# Patient Record
Sex: Female | Born: 1975 | Race: Black or African American | Hispanic: No | Marital: Single | Smoking: Never smoker
Health system: Southern US, Community
[De-identification: ages and names within clinical notes are randomized; demographics above are authoritative.]

## PROBLEM LIST (undated history)

## (undated) ENCOUNTER — Inpatient Hospital Stay (HOSPITAL_COMMUNITY): Payer: Self-pay

## (undated) DIAGNOSIS — J302 Other seasonal allergic rhinitis: Secondary | ICD-10-CM

## (undated) DIAGNOSIS — K219 Gastro-esophageal reflux disease without esophagitis: Secondary | ICD-10-CM

## (undated) DIAGNOSIS — D649 Anemia, unspecified: Secondary | ICD-10-CM

## (undated) HISTORY — PX: ABDOMINAL HYSTERECTOMY: SHX81

---

## 2001-08-05 ENCOUNTER — Emergency Department (HOSPITAL_COMMUNITY): Admission: EM | Admit: 2001-08-05 | Discharge: 2001-08-05 | Payer: Self-pay | Admitting: Emergency Medicine

## 2003-06-23 ENCOUNTER — Emergency Department (HOSPITAL_COMMUNITY): Admission: EM | Admit: 2003-06-23 | Discharge: 2003-06-23 | Payer: Self-pay | Admitting: Emergency Medicine

## 2004-08-31 ENCOUNTER — Other Ambulatory Visit: Admission: RE | Admit: 2004-08-31 | Discharge: 2004-08-31 | Payer: Self-pay | Admitting: Obstetrics & Gynecology

## 2004-12-31 ENCOUNTER — Inpatient Hospital Stay (HOSPITAL_COMMUNITY): Admission: AD | Admit: 2004-12-31 | Discharge: 2004-12-31 | Payer: Self-pay | Admitting: Obstetrics and Gynecology

## 2005-04-17 ENCOUNTER — Inpatient Hospital Stay (HOSPITAL_COMMUNITY): Admission: AD | Admit: 2005-04-17 | Discharge: 2005-04-17 | Payer: Self-pay | Admitting: Obstetrics and Gynecology

## 2005-04-18 ENCOUNTER — Inpatient Hospital Stay (HOSPITAL_COMMUNITY): Admission: RE | Admit: 2005-04-18 | Discharge: 2005-04-21 | Payer: Self-pay | Admitting: Obstetrics and Gynecology

## 2005-04-18 ENCOUNTER — Encounter (INDEPENDENT_AMBULATORY_CARE_PROVIDER_SITE_OTHER): Payer: Self-pay | Admitting: Specialist

## 2007-01-23 ENCOUNTER — Emergency Department (HOSPITAL_COMMUNITY): Admission: EM | Admit: 2007-01-23 | Discharge: 2007-01-23 | Payer: Self-pay | Admitting: Emergency Medicine

## 2007-01-30 ENCOUNTER — Emergency Department (HOSPITAL_COMMUNITY): Admission: EM | Admit: 2007-01-30 | Discharge: 2007-01-30 | Payer: Self-pay | Admitting: Emergency Medicine

## 2009-04-22 ENCOUNTER — Emergency Department (HOSPITAL_COMMUNITY): Admission: EM | Admit: 2009-04-22 | Discharge: 2009-04-22 | Payer: Self-pay | Admitting: Emergency Medicine

## 2009-08-12 ENCOUNTER — Inpatient Hospital Stay (HOSPITAL_COMMUNITY)
Admission: AD | Admit: 2009-08-12 | Discharge: 2009-08-12 | Payer: Self-pay | Source: Home / Self Care | Admitting: Obstetrics and Gynecology

## 2009-09-08 ENCOUNTER — Inpatient Hospital Stay (HOSPITAL_COMMUNITY): Admission: AD | Admit: 2009-09-08 | Discharge: 2009-09-09 | Payer: Self-pay | Admitting: Family Medicine

## 2009-09-08 ENCOUNTER — Ambulatory Visit: Payer: Self-pay | Admitting: Nurse Practitioner

## 2009-09-12 ENCOUNTER — Inpatient Hospital Stay (HOSPITAL_COMMUNITY): Admission: AD | Admit: 2009-09-12 | Discharge: 2009-09-12 | Payer: Self-pay | Admitting: Obstetrics & Gynecology

## 2009-09-12 ENCOUNTER — Ambulatory Visit: Payer: Self-pay | Admitting: Obstetrics & Gynecology

## 2009-09-18 ENCOUNTER — Ambulatory Visit: Payer: Self-pay | Admitting: Obstetrics & Gynecology

## 2010-01-04 ENCOUNTER — Emergency Department (HOSPITAL_COMMUNITY)
Admission: EM | Admit: 2010-01-04 | Discharge: 2010-01-04 | Payer: Self-pay | Source: Home / Self Care | Attending: Emergency Medicine | Admitting: Emergency Medicine

## 2010-02-16 ENCOUNTER — Other Ambulatory Visit: Payer: Self-pay | Admitting: Obstetrics and Gynecology

## 2010-03-11 ENCOUNTER — Other Ambulatory Visit (HOSPITAL_COMMUNITY): Payer: Self-pay | Admitting: Obstetrics and Gynecology

## 2010-03-11 DIAGNOSIS — Z0489 Encounter for examination and observation for other specified reasons: Secondary | ICD-10-CM

## 2010-03-11 DIAGNOSIS — Z3682 Encounter for antenatal screening for nuchal translucency: Secondary | ICD-10-CM

## 2010-03-30 ENCOUNTER — Ambulatory Visit (HOSPITAL_COMMUNITY)
Admission: RE | Admit: 2010-03-30 | Discharge: 2010-03-30 | Disposition: A | Discharge status: Home / Self Care | Payer: Medicaid Other | Source: Ambulatory Visit | Attending: Obstetrics and Gynecology | Admitting: Obstetrics and Gynecology

## 2010-03-30 ENCOUNTER — Other Ambulatory Visit (HOSPITAL_COMMUNITY): Payer: Self-pay | Admitting: Obstetrics and Gynecology

## 2010-03-30 ENCOUNTER — Ambulatory Visit (HOSPITAL_COMMUNITY): Admission: RE | Admit: 2010-03-30 | Payer: Medicaid Other | Source: Ambulatory Visit

## 2010-03-30 ENCOUNTER — Encounter (HOSPITAL_COMMUNITY): Payer: Self-pay

## 2010-03-30 DIAGNOSIS — E669 Obesity, unspecified: Secondary | ICD-10-CM | POA: Insufficient documentation

## 2010-03-30 DIAGNOSIS — O3510X0 Maternal care for (suspected) chromosomal abnormality in fetus, unspecified, not applicable or unspecified: Secondary | ICD-10-CM | POA: Insufficient documentation

## 2010-03-30 DIAGNOSIS — O351XX0 Maternal care for (suspected) chromosomal abnormality in fetus, not applicable or unspecified: Secondary | ICD-10-CM | POA: Insufficient documentation

## 2010-03-30 DIAGNOSIS — Z3689 Encounter for other specified antenatal screening: Secondary | ICD-10-CM | POA: Insufficient documentation

## 2010-03-30 DIAGNOSIS — O34219 Maternal care for unspecified type scar from previous cesarean delivery: Secondary | ICD-10-CM | POA: Insufficient documentation

## 2010-03-30 DIAGNOSIS — O99210 Obesity complicating pregnancy, unspecified trimester: Secondary | ICD-10-CM | POA: Insufficient documentation

## 2010-03-30 DIAGNOSIS — Z3682 Encounter for antenatal screening for nuchal translucency: Secondary | ICD-10-CM

## 2010-03-31 ENCOUNTER — Encounter (HOSPITAL_COMMUNITY)
Admission: RE | Admit: 2010-03-31 | Discharge: 2010-03-31 | Disposition: A | Discharge status: Home / Self Care | Payer: Medicaid Other | Source: Ambulatory Visit | Attending: Obstetrics and Gynecology | Admitting: Obstetrics and Gynecology

## 2010-03-31 DIAGNOSIS — Z01812 Encounter for preprocedural laboratory examination: Secondary | ICD-10-CM | POA: Insufficient documentation

## 2010-03-31 LAB — CBC
Hemoglobin: 10.9 g/dL — ABNORMAL LOW (ref 12.0–15.0)
MCHC: 32 g/dL (ref 30.0–36.0)
MCV: 81.4 fL (ref 78.0–100.0)
Platelets: 193 10*3/uL (ref 150–400)
RBC: 4.19 MIL/uL (ref 3.87–5.11)
RDW: 15.5 % (ref 11.5–15.5)

## 2010-04-01 ENCOUNTER — Ambulatory Visit (HOSPITAL_COMMUNITY)
Admission: RE | Admit: 2010-04-01 | Discharge: 2010-04-01 | Disposition: A | Discharge status: Home / Self Care | Payer: Medicaid Other | Source: Ambulatory Visit | Attending: Obstetrics and Gynecology | Admitting: Obstetrics and Gynecology

## 2010-04-01 ENCOUNTER — Other Ambulatory Visit: Payer: Self-pay | Admitting: Obstetrics and Gynecology

## 2010-04-01 DIAGNOSIS — O034 Incomplete spontaneous abortion without complication: Secondary | ICD-10-CM | POA: Insufficient documentation

## 2010-04-01 HISTORY — PX: DILATION AND CURETTAGE OF UTERUS: SHX78

## 2010-04-05 NOTE — Op Note (Signed)
  NAME:  Rachel Wu, JAHR NO.:  [id number]  MEDICAL RECORD NO.:  [id number]           PATIENT TYPE:  O  LOCATION:  [facility]                          [facility]  PHYSICIAN:  Malva Limes, M.D.    DATE OF BIRTH:  September 11, 1975  DATE OF PROCEDURE:  04/01/2010 DATE OF DISCHARGE:                              OPERATIVE REPORT   PREOPERATIVE DIAGNOSIS:  Spontaneous abortion.  POSTOPERATIVE DIAGNOSIS:  Spontaneous abortion.  PROCEDURE:  Dilation and evacuation.  SURGEON:  Malva Limes, MD  ANESTHESIA:  MAC and paracervical block.  DRAINS:  None.  ANTIBIOTICS:  Ancef 1 g.  ESTIMATED BLOOD LOSS:  30 mL.  SPECIMENS:  Products of conception sent to Pathology.  COMPLICATIONS:  None.  PROCEDURE:  The patient was taken to the operating room where she was placed in dorsal supine position.  MAC anesthesia was administered.  She was then placed in dorsal lithotomy position.  She was prepped with Betadine and draped in usual fashion for this procedure.  A sterile speculum was placed in the vagina.  20 mL of 1% lidocaine was used for paracervical block.  The cervix was serially dilated to a 29-French.  A 9-mm suction cannula was placed into the uterine cavity and products of conception withdrawn.  Sharp curettage was performed followed by repeat suction.  The patient tolerated the procedure well.  She was discharged to home.  She was sent home with Percocet to take p.r.n.  She was told to follow up in the office in 4 weeks.  The patient's blood type is A+ and therefore no RhoGAM is indicated.          ______________________________ Malva Limes, M.D.    MA/MEDQ  D:  04/01/2010  T:  04/02/2010  Job:  [id number]  Electronically Signed by Malva Limes M.D. on 04/05/2010 12:23:18 PM

## 2010-04-13 LAB — POCT URINALYSIS DIPSTICK
Bilirubin Urine: NEGATIVE
Glucose, UA: NEGATIVE mg/dL
Ketones, ur: NEGATIVE mg/dL
Nitrite: NEGATIVE
Protein, ur: NEGATIVE mg/dL
Specific Gravity, Urine: 1.015 (ref 1.005–1.030)
Urobilinogen, UA: 0.2 mg/dL (ref 0.0–1.0)
pH: 5.5 (ref 5.0–8.0)

## 2010-04-13 LAB — POCT PREGNANCY, URINE: Preg Test, Ur: NEGATIVE

## 2010-04-16 LAB — CBC
MCHC: 33 g/dL (ref 30.0–36.0)
MCV: 82 fL (ref 78.0–100.0)
Platelets: 195 10*3/uL (ref 150–400)
RBC: 3.98 MIL/uL (ref 3.87–5.11)
WBC: 6.3 10*3/uL (ref 4.0–10.5)

## 2010-04-16 LAB — WET PREP, GENITAL
Trich, Wet Prep: NONE SEEN
Yeast Wet Prep HPF POC: NONE SEEN

## 2010-04-16 LAB — GC/CHLAMYDIA PROBE AMP, GENITAL
Chlamydia, DNA Probe: NEGATIVE
GC Probe Amp, Genital: NEGATIVE

## 2010-04-18 LAB — GC/CHLAMYDIA PROBE AMP, GENITAL: Chlamydia, DNA Probe: NEGATIVE

## 2010-04-18 LAB — ABO/RH: ABO/RH(D): A POS

## 2010-04-18 LAB — CBC
HCT: 31.6 % — ABNORMAL LOW (ref 36.0–46.0)
Hemoglobin: 10.5 g/dL — ABNORMAL LOW (ref 12.0–15.0)
MCH: 26.7 pg (ref 26.0–34.0)
MCV: 80.6 fL (ref 78.0–100.0)
Platelets: 198 10*3/uL (ref 150–400)
RBC: 3.92 MIL/uL (ref 3.87–5.11)
WBC: 7.1 10*3/uL (ref 4.0–10.5)

## 2010-04-18 LAB — WET PREP, GENITAL: Yeast Wet Prep HPF POC: NONE SEEN

## 2010-04-18 LAB — HCG, QUANTITATIVE, PREGNANCY: hCG, Beta Chain, Quant, S: 15584 m[IU]/mL — ABNORMAL HIGH (ref <5)

## 2010-04-18 LAB — URINALYSIS, ROUTINE W REFLEX MICROSCOPIC
Bilirubin Urine: NEGATIVE
Specific Gravity, Urine: 1.01 (ref 1.005–1.030)

## 2010-04-18 LAB — POCT PREGNANCY, URINE: Preg Test, Ur: POSITIVE

## 2010-05-10 ENCOUNTER — Other Ambulatory Visit (HOSPITAL_COMMUNITY): Payer: Self-pay

## 2010-06-18 NOTE — Op Note (Signed)
NAME:  Rachel Wu, Rachel Wu NO.:  [id number]   MEDICAL RECORD NO.:  [id number]          PATIENT TYPE:  INP   LOCATION:  9102                          [facility]   PHYSICIAN:  Malva Limes, M.D.    DATE OF BIRTH:  08/14/1975   DATE OF PROCEDURE:  04/18/2005  DATE OF DISCHARGE:                                 OPERATIVE REPORT   PREOPERATIVE DIAGNOSES:  1.  Intrauterine pregnancy at term.  2.  History of prior cesarean section.  3.  Patient desires repeat cesarean section.   POSTOPERATIVE DIAGNOSES:  1.  Intrauterine pregnancy at term.  2.  History of prior cesarean section.  3.  Patient desires repeat cesarean section.  4.  Extensive adhesions.   OPERATION/PROCEDURE:  1.  Repeat low transverse cesarean section.  2.  Lysis of adhesions.   SURGEON:  Malva Limes, M.D.   ANESTHESIA:  Spinal.   ANTIBIOTICS:  Ancef 1 g.   DRAINS:  Foley to bedside drainage.   ESTIMATED BLOOD LOSS:  900 mL.   COMPLICATIONS:  None.   SPECIMENS:  Placenta sent to pathology.   FINDINGS:  The patient delivered one live viable black female, Apgars 8 at  one minute and 9 at five minutes.  Cord PH was 7.296. The weight was 7  pounds 7 ounces.   DESCRIPTION OF PROCEDURE:  The patient was taken to the operating room where  spinal anesthetic was administered without difficulty.  She was then placed  in the  dorsal supine position with a left lateral tilt.  The patient was  prepped with Betadine and a Foley was placed.  She was then draped in the  usual fashion for this procedure.   A Pfannenstiel incision was made through the previous scar.  This was  carried down to the fascia.  On entering the fascia, the Mayo scissors were  used to open up the fascia laterally.  The rectus muscles were then  dissected from the fascia with the Bovie.  Rectus muscles were divided in  the midline and taken superiorly and inferiorly.  Parietal peritoneum was  entered sharply.  On entering the  abdominal cavity, the patient was noted to  have dense omental adhesions involving the anterior uterus.  These were all  taken down with the Bovie.  Once the uterus was freed, the bladder flap was  taken down sharply.  A low transverse uterine incision was made in the  midline and extended laterally with blunt dissection.  On entering the  amniotic sac, moderate meconium was noted.  The infant was delivered in the  vertex presentation.  The nostrils and oropharynx DeLee and bulb-suctioned.  The remaining infant was then delivered.  The cord was doubly clamped, cut  and the baby handed to the waiting NICU team.  Cord gas and cord blood were  obtained.  The placenta was manually removed.  The uterine cavity was  cleaned with a wet lap.  The uterine incision was closed in a single layer  of 0 Monocryl in a running locking fashion.  Bladder flap was not closed.  The uterus  was placed back in the abdominal cavity.  Ovaries and fallopian  tubes appeared to be normal bilaterally.  At this point some omental  adhesions to the anterior abdominal wall were taken down slowly.  The  parietal peritoneum was then reapproximated in the midline using 2-0  Monocryl suture in a running fashion. The fascia was closed using 0 Monocryl  suture in a running fashion.  Subcuticular tissue was made hemostatic with  the Bovie.  Stainless steel clips were to close the skin. The patient  tolerated the procedure well.  She was taken to the recovery room in stable  condition.  Instrument and laps counts were correct x2.           ______________________________  Malva Limes, M.D.     MA/MEDQ  D:  04/18/2005  T:  04/19/2005  Job:  [id number]

## 2010-06-18 NOTE — Discharge Summary (Signed)
NAME:  Rachel Wu, Rachel Wu NO.:  [id number]   MEDICAL RECORD NO.:  [id number]          PATIENT TYPE:  INP   LOCATION:  9102                          [facility]   PHYSICIAN:  Malva Limes, M.D.    DATE OF BIRTH:  1976/01/26   DATE OF ADMISSION:  04/18/2005  DATE OF DISCHARGE:  04/21/2005                                 DISCHARGE SUMMARY   FINAL DIAGNOSIS:  1.  Intrauterine pregnancy at term.  2.  History of prior cesarean section, patient desires repeat cesarean      section.  3.  Extensive adhesions.  4.  Postoperative anemia.   PROCEDURE:  Repeat low segment transverse cesarean section and lysis of  adhesions.   SURGEON:  Dr. Malva Limes   COMPLICATIONS:  None.   This 35 year old G3, P1-0-1-1 presents at term for repeat cesarean section.  Patient had had a history of prior cesarean section with her past pregnancy,  desires a repeat with this pregnancy.  Her antepartum course up to this  point had been uncomplicated.  She did have a positive group B Strep  obtained in the office around 36 weeks.  She was taken to the operating room  on April 18, 2005 by Dr. Malva Limes where a repeat low transverse  cesarean section was performed with the delivery of a 7 pound 7 ounce female  infant with Apgars of 8 and 9.  Because there were a large amount of  adhesions these were lysed.  Patient's postoperative course was benign  without any significant fevers.  She did have some mild postoperative anemia  and was started on iron during her hospital stay.  She was ready for  discharge on postoperative day #3.  She was sent home on a regular diet,  told to decrease activity, told to continue her prenatal vitamins and an  iron supplement three times daily.  Was to take ibuprofen up to 600 mg every  six hours as needed for pain.  Was to use Percocet one to two every four  hours as needed for pain.  Was to follow up in the office on March 23 for  her staple removal.  Of  course to call with any increased bleeding, pain, or  problem.   LABORATORIES ON DISCHARGE:  Patient had a hemoglobin of 9.6, white blood  cell count of 8, platelets of 115,000.      Leilani Able, P.A.-C.    ______________________________  Malva Limes, M.D.    MB/MEDQ  D:  05/30/2005  T:  05/31/2005  Job:  [id number]

## 2010-08-10 ENCOUNTER — Inpatient Hospital Stay (INDEPENDENT_AMBULATORY_CARE_PROVIDER_SITE_OTHER)
Admission: RE | Admit: 2010-08-10 | Discharge: 2010-08-10 | Disposition: A | Discharge status: Home / Self Care | Payer: Self-pay | Source: Ambulatory Visit | Attending: Emergency Medicine | Admitting: Emergency Medicine

## 2010-08-10 DIAGNOSIS — Z331 Pregnant state, incidental: Secondary | ICD-10-CM

## 2010-08-10 DIAGNOSIS — S335XXA Sprain of ligaments of lumbar spine, initial encounter: Secondary | ICD-10-CM

## 2010-08-10 LAB — POCT URINALYSIS DIP (DEVICE)
Bilirubin Urine: NEGATIVE
Glucose, UA: NEGATIVE mg/dL
Leukocytes, UA: NEGATIVE
Nitrite: NEGATIVE
Protein, ur: NEGATIVE mg/dL
Urobilinogen, UA: 0.2 mg/dL (ref 0.0–1.0)
pH: 6.5 (ref 5.0–8.0)

## 2010-08-10 LAB — POCT PREGNANCY, URINE: Preg Test, Ur: POSITIVE

## 2010-08-26 LAB — ANTIBODY SCREEN: Antibody Screen: NEGATIVE

## 2010-08-26 LAB — HIV ANTIBODY (ROUTINE TESTING W REFLEX): HIV: NONREACTIVE

## 2010-08-26 LAB — HEPATITIS B SURFACE ANTIGEN: Hepatitis B Surface Ag: NEGATIVE

## 2010-08-26 LAB — ABO/RH: RH Type: POSITIVE

## 2010-08-26 LAB — RUBELLA ANTIBODY, IGM: Rubella: IMMUNE

## 2010-09-20 ENCOUNTER — Other Ambulatory Visit: Payer: Self-pay | Admitting: Obstetrics and Gynecology

## 2010-09-20 ENCOUNTER — Other Ambulatory Visit (HOSPITAL_COMMUNITY): Payer: Self-pay | Admitting: Obstetrics and Gynecology

## 2010-09-20 DIAGNOSIS — Z369 Encounter for antenatal screening, unspecified: Secondary | ICD-10-CM

## 2010-09-29 ENCOUNTER — Ambulatory Visit (HOSPITAL_COMMUNITY)
Admission: RE | Admit: 2010-09-29 | Discharge: 2010-09-29 | Disposition: A | Discharge status: Home / Self Care | Payer: Medicaid Other | Source: Ambulatory Visit | Attending: Obstetrics and Gynecology | Admitting: Obstetrics and Gynecology

## 2010-09-29 ENCOUNTER — Ambulatory Visit (HOSPITAL_COMMUNITY): Admission: RE | Admit: 2010-09-29 | Payer: Medicaid Other | Source: Ambulatory Visit

## 2010-09-29 ENCOUNTER — Encounter (HOSPITAL_COMMUNITY): Payer: Self-pay

## 2010-09-29 ENCOUNTER — Other Ambulatory Visit (HOSPITAL_COMMUNITY): Payer: Self-pay | Admitting: Obstetrics and Gynecology

## 2010-09-29 VITALS — BP 109/71 | HR 85 | Wt 268.0 lb

## 2010-09-29 DIAGNOSIS — O34219 Maternal care for unspecified type scar from previous cesarean delivery: Secondary | ICD-10-CM | POA: Insufficient documentation

## 2010-09-29 DIAGNOSIS — Z369 Encounter for antenatal screening, unspecified: Secondary | ICD-10-CM

## 2010-09-29 DIAGNOSIS — O99210 Obesity complicating pregnancy, unspecified trimester: Secondary | ICD-10-CM | POA: Insufficient documentation

## 2010-09-29 DIAGNOSIS — O351XX0 Maternal care for (suspected) chromosomal abnormality in fetus, not applicable or unspecified: Secondary | ICD-10-CM | POA: Insufficient documentation

## 2010-09-29 DIAGNOSIS — O3510X0 Maternal care for (suspected) chromosomal abnormality in fetus, unspecified, not applicable or unspecified: Secondary | ICD-10-CM | POA: Insufficient documentation

## 2010-09-29 DIAGNOSIS — E669 Obesity, unspecified: Secondary | ICD-10-CM | POA: Insufficient documentation

## 2010-09-29 NOTE — Progress Notes (Signed)
Ultrasound in AS/OBGYN/EPIC.  Follow up U/S scheduled; First trimester screen

## 2010-10-06 ENCOUNTER — Other Ambulatory Visit (HOSPITAL_COMMUNITY): Payer: Self-pay | Admitting: Obstetrics and Gynecology

## 2010-10-06 ENCOUNTER — Ambulatory Visit (HOSPITAL_COMMUNITY)
Admission: RE | Admit: 2010-10-06 | Discharge: 2010-10-06 | Disposition: A | Discharge status: Home / Self Care | Payer: Medicaid Other | Source: Ambulatory Visit | Attending: Obstetrics and Gynecology | Admitting: Obstetrics and Gynecology

## 2010-10-06 ENCOUNTER — Other Ambulatory Visit: Payer: Self-pay | Admitting: Maternal and Fetal Medicine

## 2010-10-06 DIAGNOSIS — Z369 Encounter for antenatal screening, unspecified: Secondary | ICD-10-CM

## 2010-10-06 DIAGNOSIS — O3510X0 Maternal care for (suspected) chromosomal abnormality in fetus, unspecified, not applicable or unspecified: Secondary | ICD-10-CM | POA: Insufficient documentation

## 2010-10-06 DIAGNOSIS — Z3689 Encounter for other specified antenatal screening: Secondary | ICD-10-CM | POA: Insufficient documentation

## 2010-10-06 DIAGNOSIS — O351XX0 Maternal care for (suspected) chromosomal abnormality in fetus, not applicable or unspecified: Secondary | ICD-10-CM | POA: Insufficient documentation

## 2010-11-11 ENCOUNTER — Ambulatory Visit (HOSPITAL_COMMUNITY)
Admission: RE | Admit: 2010-11-11 | Discharge: 2010-11-11 | Disposition: A | Discharge status: Home / Self Care | Payer: Medicaid Other | Source: Ambulatory Visit | Attending: Obstetrics and Gynecology | Admitting: Obstetrics and Gynecology

## 2010-11-11 DIAGNOSIS — O34219 Maternal care for unspecified type scar from previous cesarean delivery: Secondary | ICD-10-CM | POA: Insufficient documentation

## 2010-11-11 DIAGNOSIS — O99210 Obesity complicating pregnancy, unspecified trimester: Secondary | ICD-10-CM | POA: Insufficient documentation

## 2010-11-11 DIAGNOSIS — Z369 Encounter for antenatal screening, unspecified: Secondary | ICD-10-CM

## 2010-11-11 DIAGNOSIS — Z1389 Encounter for screening for other disorder: Secondary | ICD-10-CM | POA: Insufficient documentation

## 2010-11-11 DIAGNOSIS — E669 Obesity, unspecified: Secondary | ICD-10-CM | POA: Insufficient documentation

## 2010-11-11 DIAGNOSIS — Z363 Encounter for antenatal screening for malformations: Secondary | ICD-10-CM | POA: Insufficient documentation

## 2010-11-11 DIAGNOSIS — O358XX0 Maternal care for other (suspected) fetal abnormality and damage, not applicable or unspecified: Secondary | ICD-10-CM | POA: Insufficient documentation

## 2010-12-01 IMAGING — US US OB TRANSVAGINAL MODIFY
1 series · 13 of 28 positions shown · non-contrast
Comparison: Prior ultrasound of pregnancy performed 08/12/2009

CLINICAL DATA: Vaginal bleeding.

OBSTETRIC <14 WK US AND TRANSVAGINAL OB US
TECHNIQUE: Both transabdominal and transvaginal ultrasound
examinations were performed for complete evaluation of the
gestation as well as the maternal uterus, adnexal regions, and
pelvic cul-de-sac.  Transvaginal technique was performed to assess
early pregnancy.

[Series 1: us ob comp less 14 wks · 0.21mm/px · 13 of 31 slices shown]
[im 2/31]
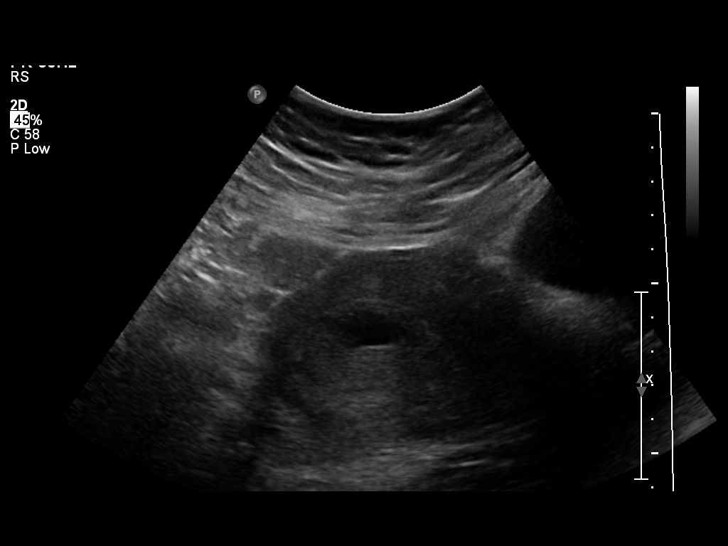
[im 4/31]
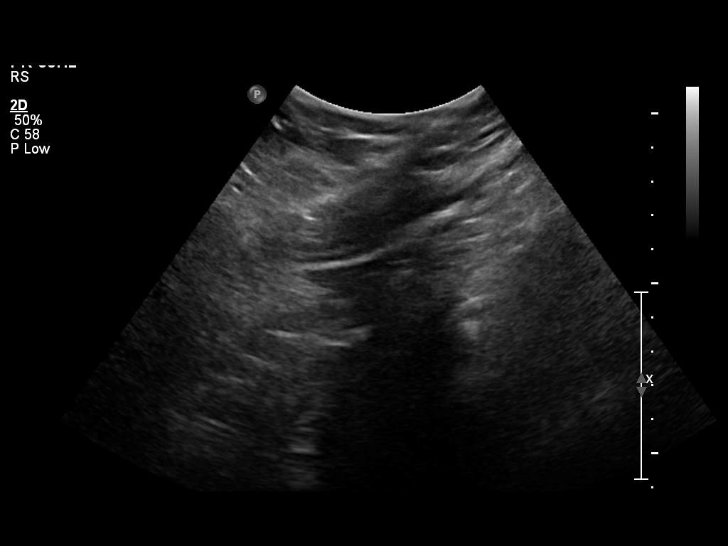
[im 6/31]
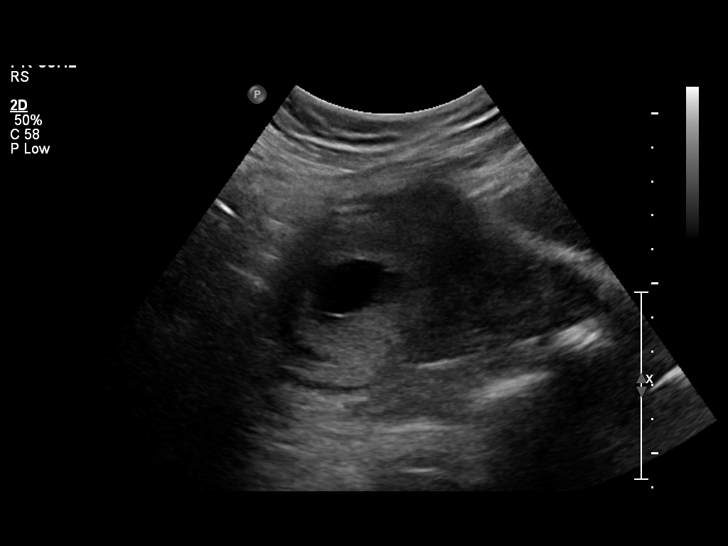
[im 8/31]
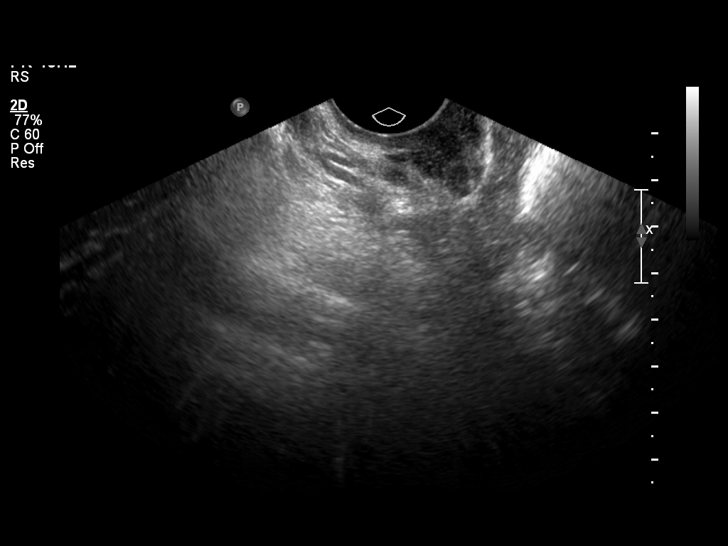
[im 11/31]
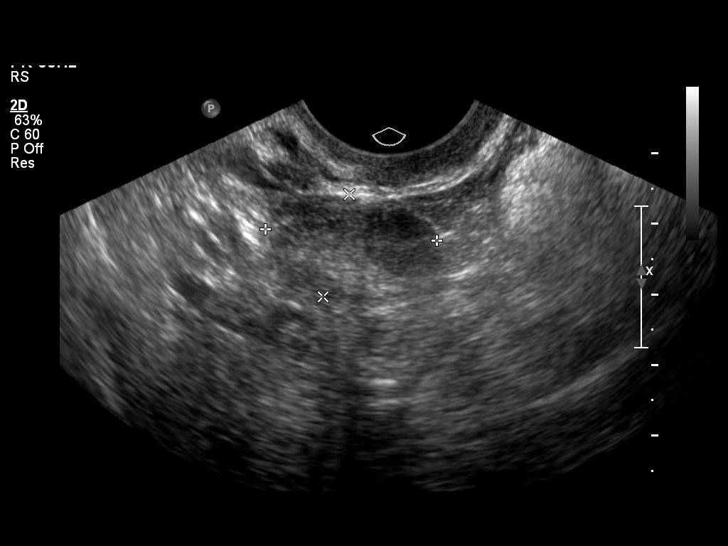
[im 13/31]
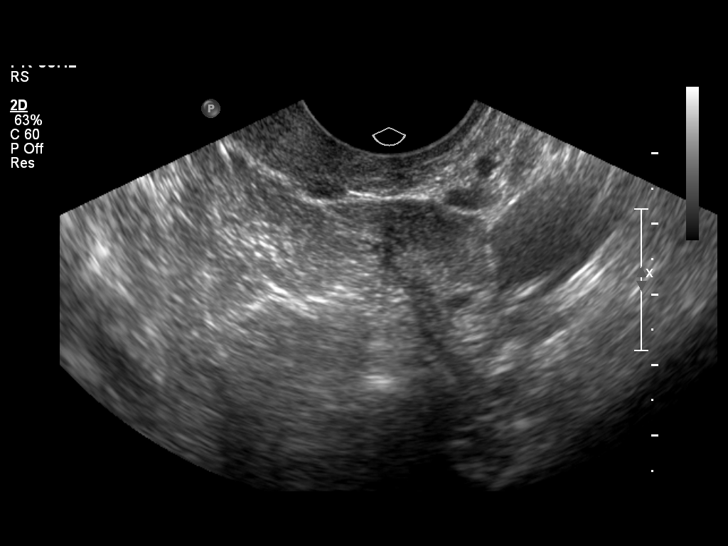
[im 16/31]
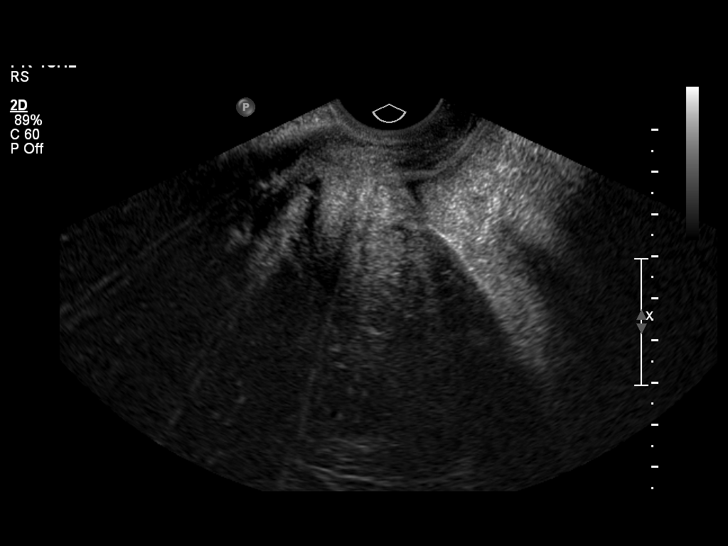
[im 18/31]
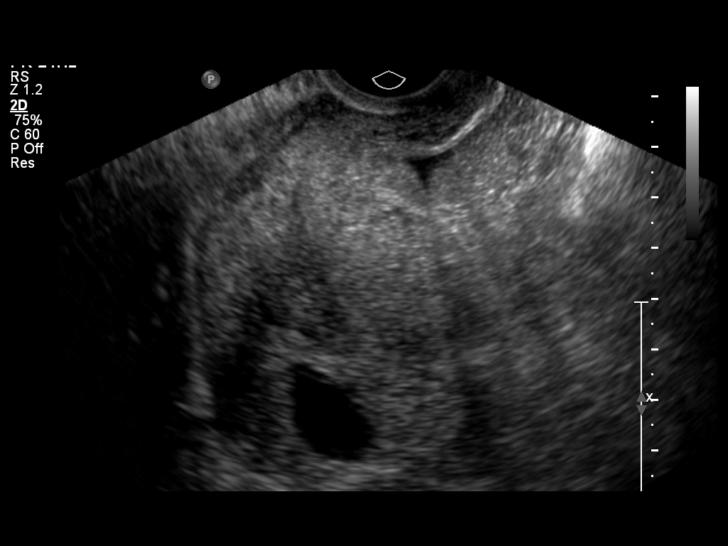
[im 21/31]
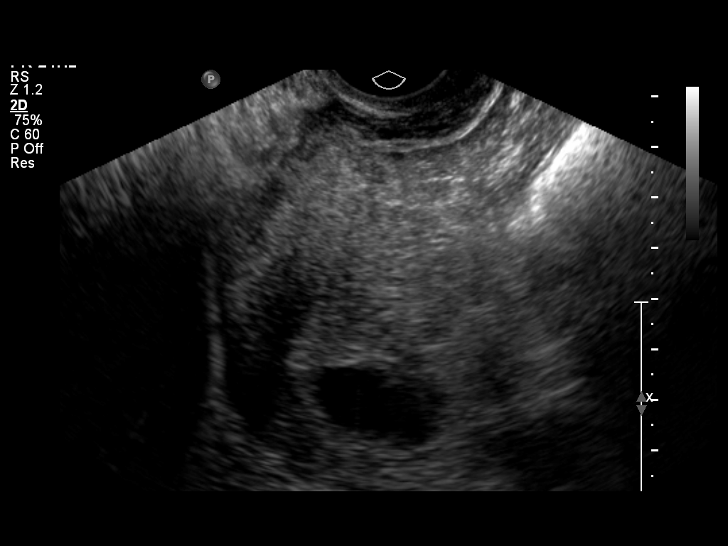
[im 23/31]
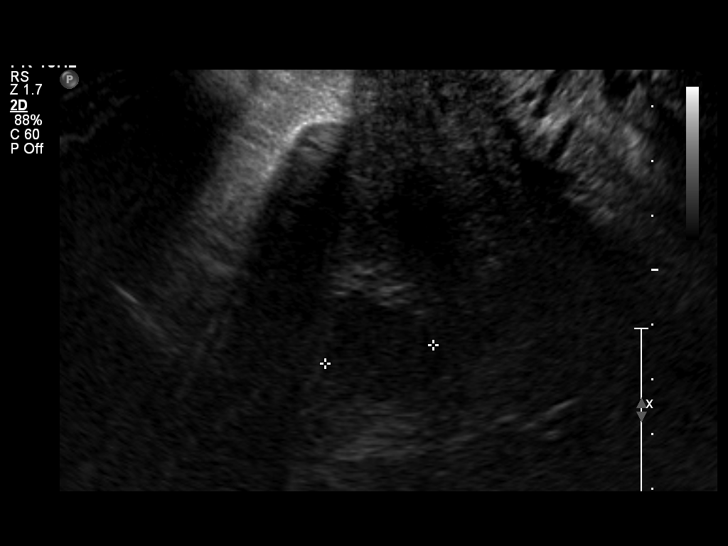
[im 25/31]
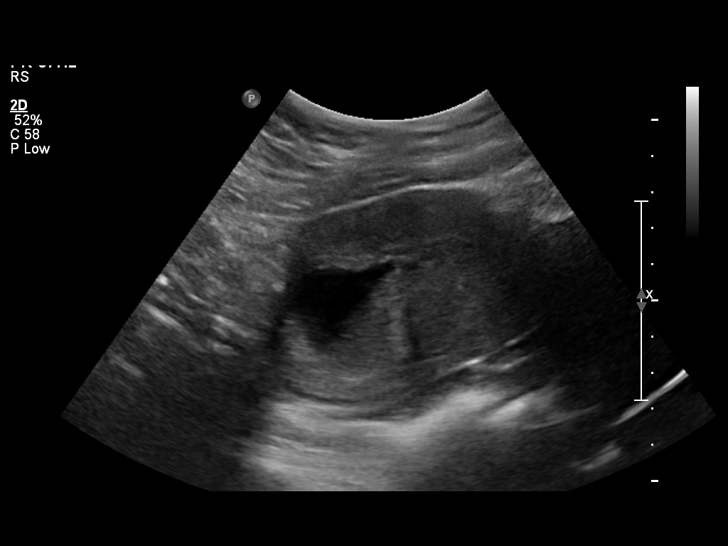
[im 27/31]
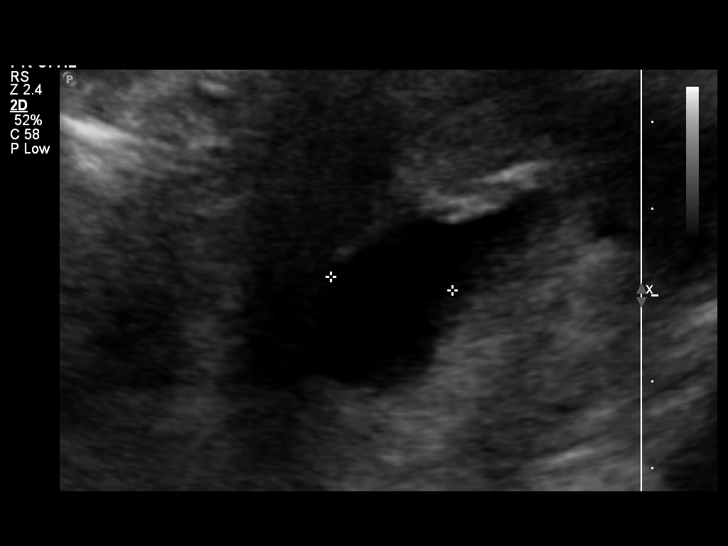
[im 29/31]
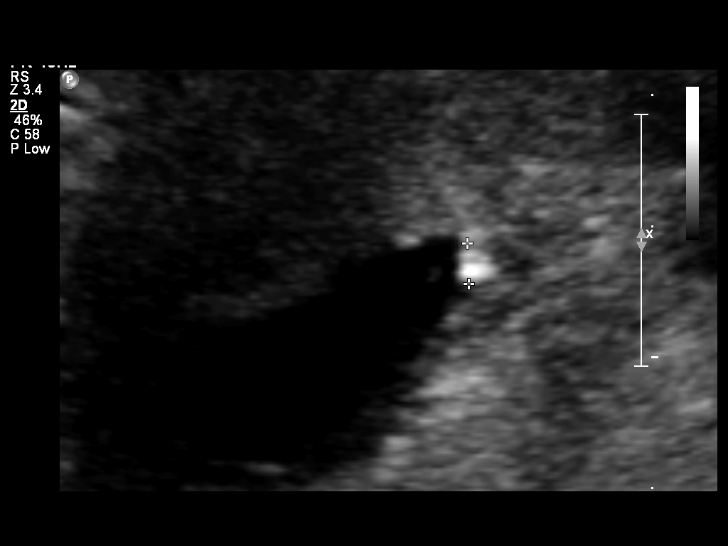

[13 of 28 positions shown; findings below may reference images not displayed]

FINDINGS: A single intrauterine gestational sac is identified. No yolk sac is
now seen.  The embryo has decreased slightly in size since the
prior ultrasound, measuring 3.1 mm in crown-rump length.  This
reflects a delay of 4 weeks in measured gestational age since the
prior study, and is compatible with fetal demise.  No cardiac
activity is visualized.  The gestational sac is more irregular than
on the prior study.

No subchorionic hemorrhage is seen.  The uterus is otherwise
unremarkable in appearance.

The right ovary is not visualized.  The left ovary measures 2.4 x
1.6 x 1.5 cm.  No suspicious adnexal masses are seen.  Limited
Doppler evaluation demonstrates normal color Doppler blood flow
with respect to the left ovary.

No free fluid is seen within the pelvic cul-de-sac.
IMPRESSION: No interval growth in the embryo over the past 4 weeks; no cardiac
activity seen.  Findings compatible with fetal demise.

## 2010-12-04 IMAGING — US US TRANSVAGINAL NON-OB
1 series · 14 of 25 positions shown · non-contrast
Comparison: 09/09/2009

CLINICAL DATA: Spontaneous abortion 09/09/2009 with administration
of Cytotec.  Vaginal bleeding.

TRANSVAGINAL ULTRASOUND OF PELVIS
TECHNIQUE: Transvaginal ultrasound examination of the pelvis was
performed including evaluation of the uterus, ovaries, adnexal
regions, and pelvic cul-de-sac.

[Series 1: us transvaginal non-ob · 0.17mm/px · 14 of 28 slices shown]
[im 1/28]
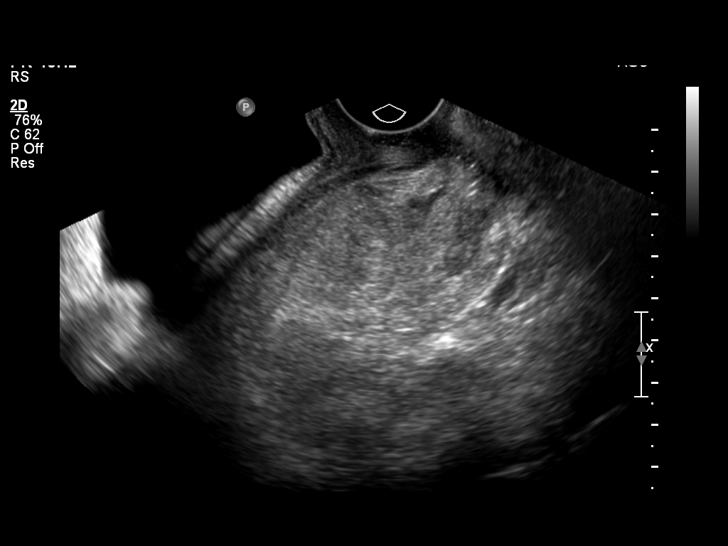
[im 3/28]
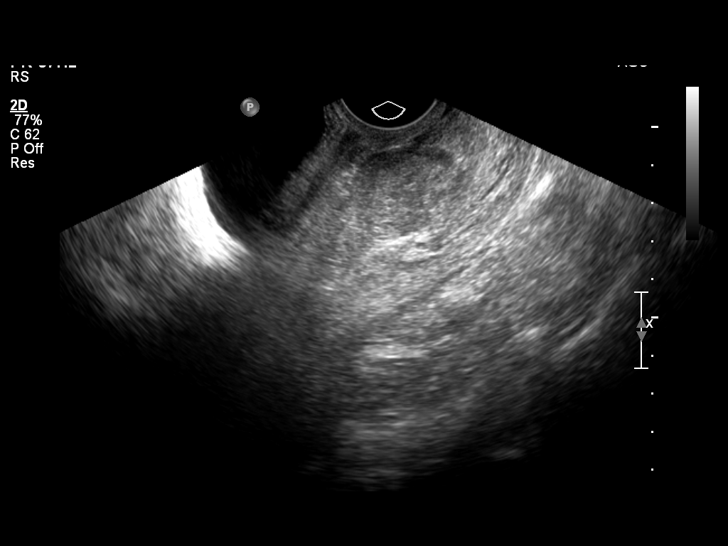
[im 5/28]
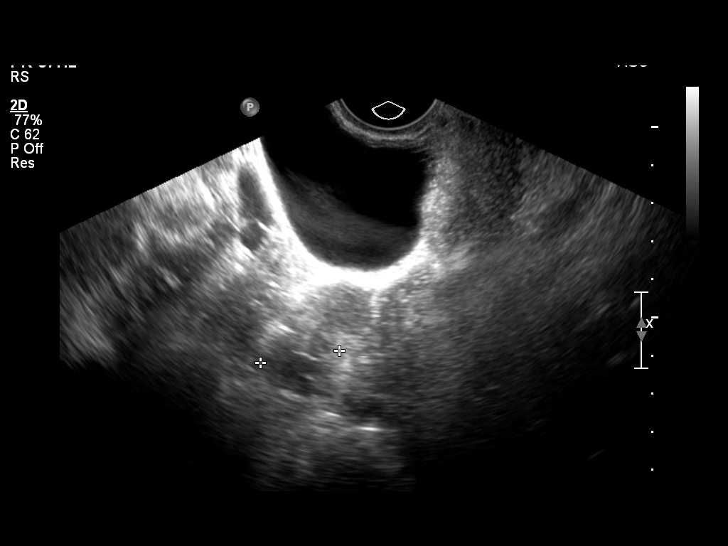
[im 7/28]
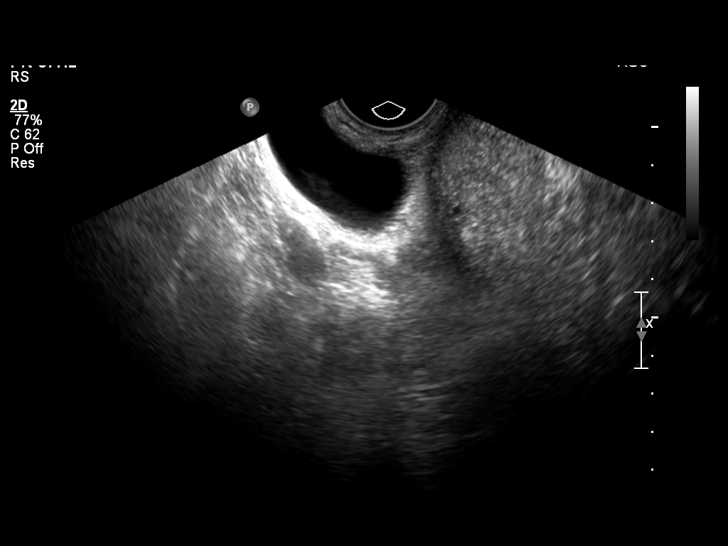
[im 10/28]
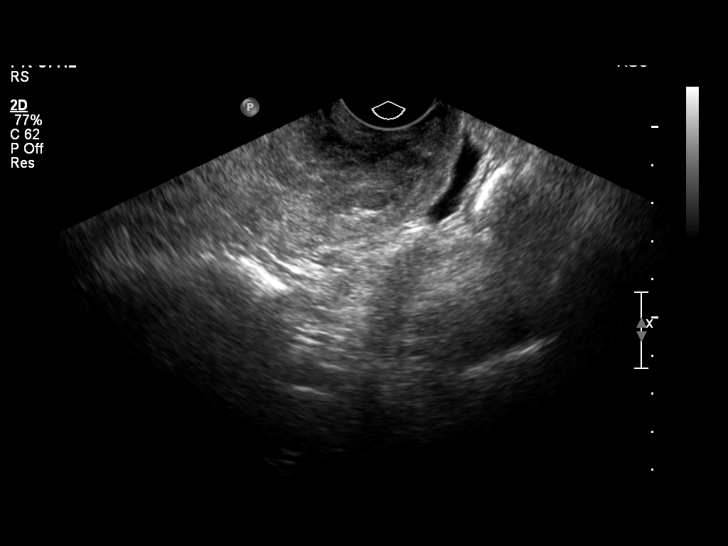
[im 11/28]
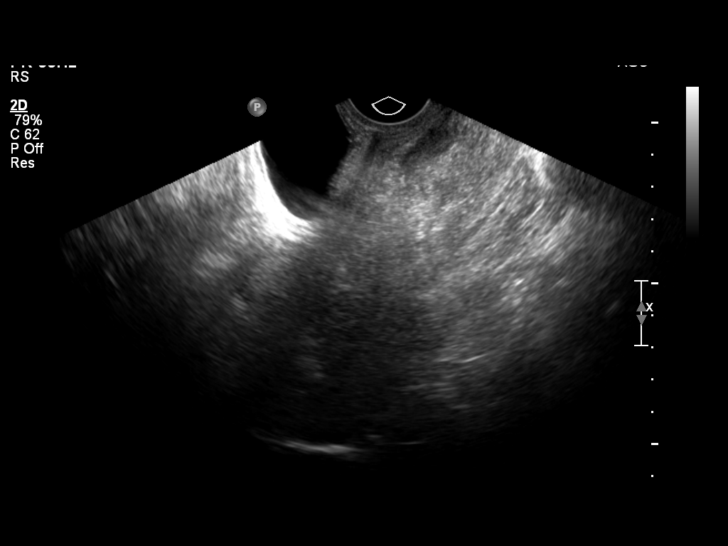
[im 13/28]
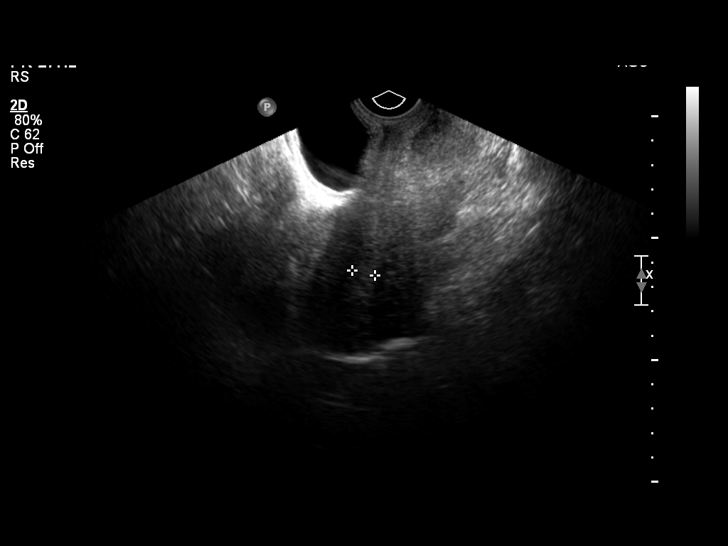
[im 15/28]
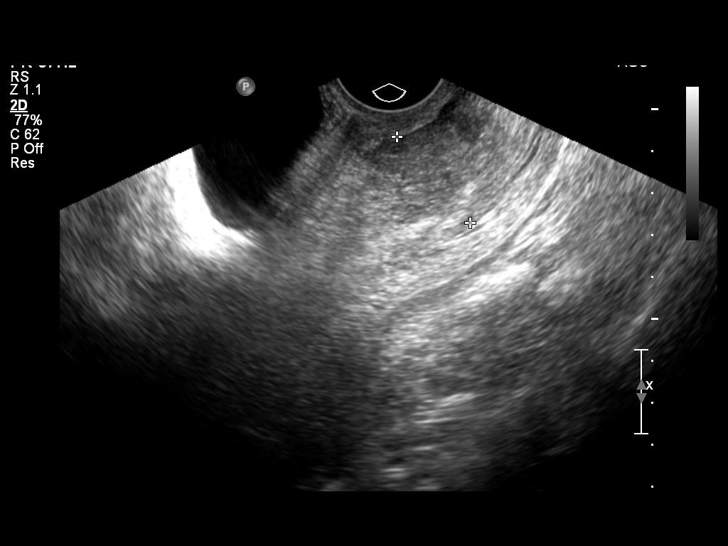
[im 17/28]
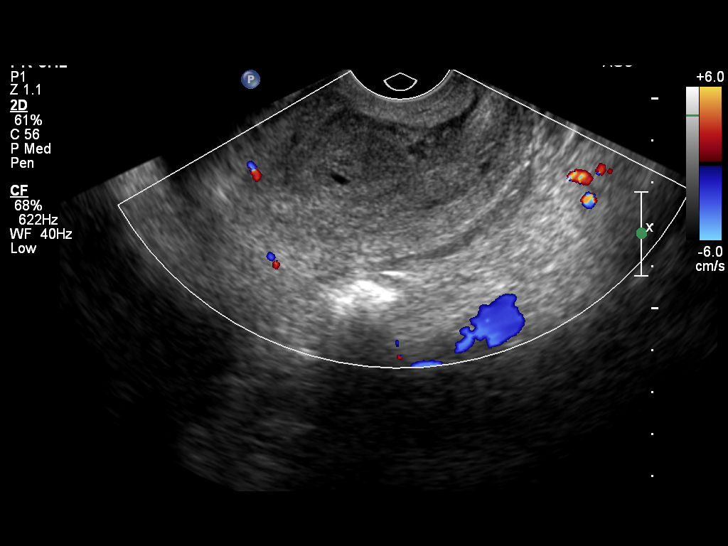
[im 19/28]
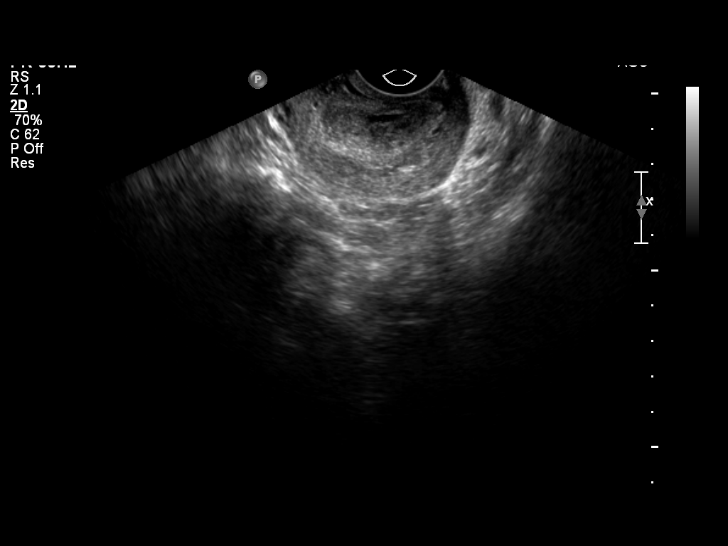
[im 21/28]
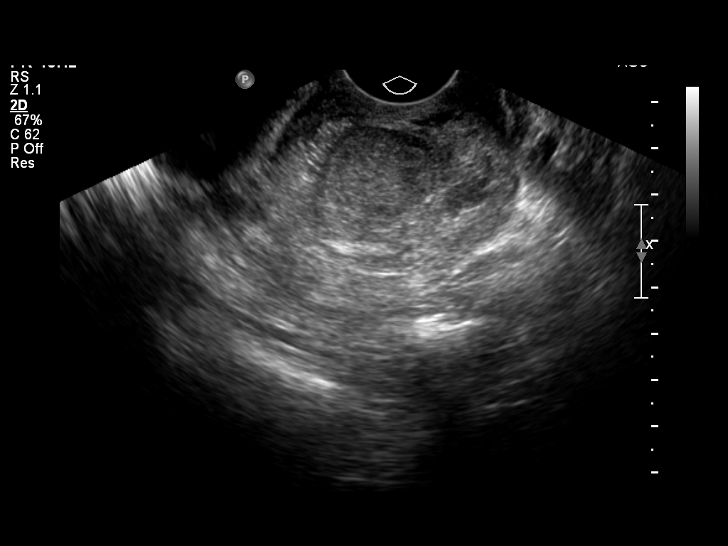
[im 23/28]
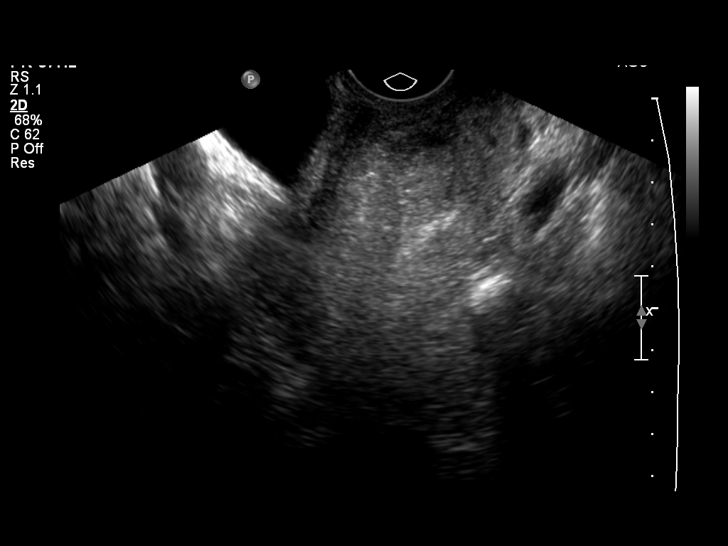
[im 25/28]
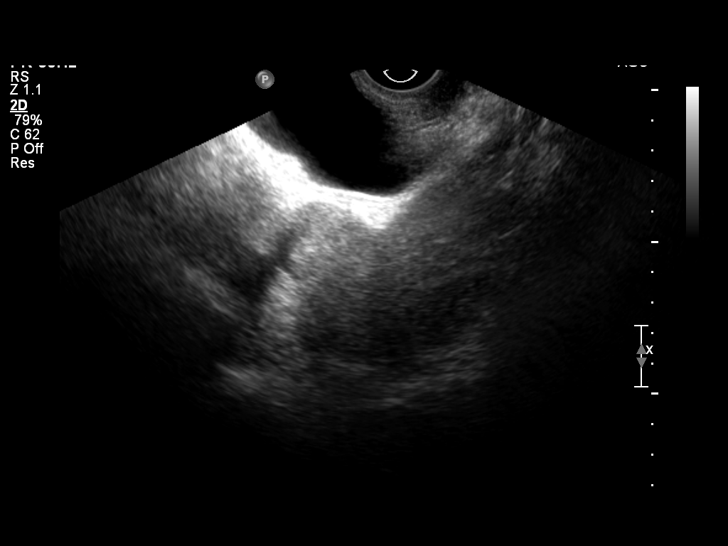
[im 28/28]
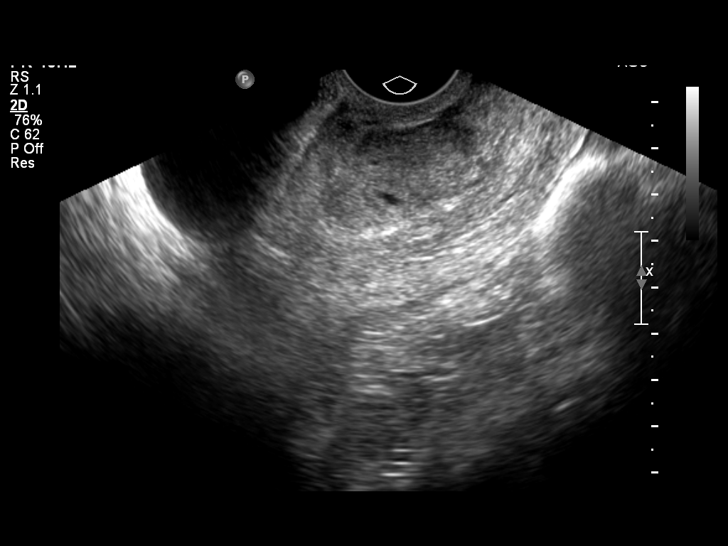

[14 of 25 positions shown; findings below may reference images not displayed]

FINDINGS: Uterus anteverted, retroflexed.  This makes visualization of the
fundus suboptimal.  No intrauterine gestational sac now visualized.

Endometrium heterogeneous thickening measuring up to 3 cm without
internal color flow at the level of the lower uterine segment.9 mm
at the level of the fundus.

Right Ovary 3.8 x 2.1 x 1.4 cm.  Normal.

Left Ovary not visualized.  No adnexal mass.

Other Findings:  Trace free fluid.
IMPRESSION: Thickened heterogeneous endometrium at the level of the lower
uterine segment which may represent retained products of conception
or clot/debris.  Previously seen intrauterine gestational sac no
longer visualized.

## 2010-12-09 ENCOUNTER — Ambulatory Visit (HOSPITAL_COMMUNITY)
Admission: RE | Admit: 2010-12-09 | Discharge: 2010-12-09 | Disposition: A | Discharge status: Home / Self Care | Payer: Medicaid Other | Source: Ambulatory Visit | Attending: Obstetrics and Gynecology | Admitting: Obstetrics and Gynecology

## 2010-12-09 DIAGNOSIS — O99212 Obesity complicating pregnancy, second trimester: Secondary | ICD-10-CM

## 2010-12-09 DIAGNOSIS — E669 Obesity, unspecified: Secondary | ICD-10-CM | POA: Insufficient documentation

## 2010-12-09 DIAGNOSIS — Z369 Encounter for antenatal screening, unspecified: Secondary | ICD-10-CM

## 2010-12-09 DIAGNOSIS — O34219 Maternal care for unspecified type scar from previous cesarean delivery: Secondary | ICD-10-CM | POA: Insufficient documentation

## 2010-12-09 DIAGNOSIS — O99210 Obesity complicating pregnancy, unspecified trimester: Secondary | ICD-10-CM | POA: Insufficient documentation

## 2010-12-09 NOTE — Progress Notes (Signed)
Ultrasound in AS/OBGYN/EPIC.  Follow up U/S scheduled 

## 2011-01-06 ENCOUNTER — Inpatient Hospital Stay (HOSPITAL_COMMUNITY)
Admission: AD | Admit: 2011-01-06 | Discharge: 2011-01-07 | Disposition: A | Discharge status: Home / Self Care | Payer: Medicaid Other | Source: Ambulatory Visit | Attending: Obstetrics and Gynecology | Admitting: Obstetrics and Gynecology

## 2011-01-06 DIAGNOSIS — O99891 Other specified diseases and conditions complicating pregnancy: Secondary | ICD-10-CM | POA: Insufficient documentation

## 2011-01-06 DIAGNOSIS — J069 Acute upper respiratory infection, unspecified: Secondary | ICD-10-CM | POA: Insufficient documentation

## 2011-01-06 DIAGNOSIS — H6690 Otitis media, unspecified, unspecified ear: Secondary | ICD-10-CM

## 2011-01-06 HISTORY — DX: Anemia, unspecified: D64.9

## 2011-01-07 ENCOUNTER — Encounter (HOSPITAL_COMMUNITY): Payer: Self-pay | Admitting: *Deleted

## 2011-01-07 LAB — INFLUENZA PANEL BY PCR (TYPE A & B): Influenza A By PCR: POSITIVE — AB

## 2011-01-07 MED ORDER — AMOXICILLIN 500 MG PO CAPS: 500.0000 mg | ORAL_CAPSULE | Freq: Two times a day (BID) | ORAL | Status: AC

## 2011-01-07 MED ORDER — DEXTROMETHORPHAN HBR 15 MG/5ML PO SYRP: 10.0000 mL | ORAL_SOLUTION | Freq: Four times a day (QID) | ORAL | Status: AC | PRN

## 2011-01-07 MED ORDER — LORATADINE 10 MG PO TABS: 10.0000 mg | ORAL_TABLET | Freq: Every day | ORAL | Status: DC

## 2011-01-07 NOTE — ED Provider Notes (Signed)
History     Chief Complaint  Patient presents with  . Otalgia   HPI 35 y.o. [id number] at [redacted]w[redacted]d c/o sore throat, cough, runny/itchy nose and left ear pain x 1 week. Fever intermittently, none today. Hasn't taken any medications for symptoms. + fetal movement, no contractions, LOF or vaginal bleeding.     Past Medical History  Diagnosis Date  . Anemia     Past Surgical History  Procedure Date  . Cesarean section     Family History  Problem Relation Age of Onset  . Hypertension Mother   . Diabetes Father   . Hypertension Father     History  Substance Use Topics  . Smoking status: Never Smoker   . Smokeless tobacco: Not on file  . Alcohol Use: No    Allergies: No Known Allergies  Prescriptions prior to admission  Medication Sig Dispense Refill  . Calcium Carbonate Antacid (TUMS PO) Take 1 tablet by mouth daily.        Marland Kitchen PRENATAL VITAMINS PO Take by mouth.          Review of Systems  HENT: Positive for ear pain, congestion and sore throat.   Respiratory: Positive for cough.   Cardiovascular: Negative.   Gastrointestinal: Negative for nausea, vomiting, abdominal pain, diarrhea and constipation.  Genitourinary: Negative for dysuria, urgency, frequency, hematuria and flank pain.       Negative for vaginal bleeding, cramping/contractions  Musculoskeletal: Negative.   Neurological: Negative.   Psychiatric/Behavioral: Negative.    Physical Exam   Blood pressure 124/78, pulse 88, temperature 98 F (36.7 C), temperature source Oral, resp. rate 18, height 5\' 6"  (1.676 m), weight 274 lb 3.2 oz (124.376 kg), SpO2 99.00%.  Physical Exam  Nursing note and vitals reviewed. Constitutional: She appears well-developed and well-nourished. No distress.  HENT:  Head: Macrocephalic.  Right Ear: Hearing and tympanic membrane normal.  Left Ear: Tympanic membrane is erythematous. Tympanic membrane is not perforated, not retracted and not bulging.  Nose: Rhinorrhea present.    Mouth/Throat: Uvula is midline and oropharynx is clear and moist. No posterior oropharyngeal erythema.  Respiratory: No respiratory distress. She has no decreased breath sounds. She has no wheezes. She has no rhonchi. She has no rales.   NST: 140, mod variability, + 10x10 accels MAU Course  Procedures  Flu test pending  Assessment and Plan  35 y.o. [id number] with URI, left otitis media Rx Amoxicillin 500 mg 1 po bid x 10 days Rev'd safe meds in pregnancy, Robitussin for cough, Claritin for runny/itchy nose F/U as scheduled or sooner PRN  Baudelio Karnes 01/07/2011, 1:11 AM

## 2011-01-07 NOTE — Progress Notes (Signed)
Pt states she is having sore throat and pain in her left ear as well as pain in her stomach when she coughs

## 2011-01-07 NOTE — Progress Notes (Signed)
Pt reports congestion, sore throat, vomiting x 1 wk.  Pt reports fever of 101 until today.  Pt having left earache x 2 days.  Pt having upper abd tightness after coughing.  Pt G6 P2 at 26wks.

## 2011-01-20 ENCOUNTER — Ambulatory Visit (HOSPITAL_COMMUNITY)
Admission: RE | Admit: 2011-01-20 | Discharge: 2011-01-20 | Disposition: A | Discharge status: Home / Self Care | Payer: Medicaid Other | Source: Ambulatory Visit | Attending: Obstetrics and Gynecology | Admitting: Obstetrics and Gynecology

## 2011-01-20 DIAGNOSIS — E669 Obesity, unspecified: Secondary | ICD-10-CM | POA: Insufficient documentation

## 2011-01-20 DIAGNOSIS — O34219 Maternal care for unspecified type scar from previous cesarean delivery: Secondary | ICD-10-CM | POA: Insufficient documentation

## 2011-01-20 DIAGNOSIS — O99212 Obesity complicating pregnancy, second trimester: Secondary | ICD-10-CM

## 2011-03-24 ENCOUNTER — Encounter (HOSPITAL_COMMUNITY): Payer: Self-pay | Admitting: Pharmacist

## 2011-03-29 ENCOUNTER — Other Ambulatory Visit: Payer: Self-pay | Admitting: Obstetrics and Gynecology

## 2011-03-31 ENCOUNTER — Encounter (HOSPITAL_COMMUNITY): Payer: Self-pay

## 2011-03-31 ENCOUNTER — Encounter (HOSPITAL_COMMUNITY)
Admission: RE | Admit: 2011-03-31 | Discharge: 2011-03-31 | Disposition: A | Discharge status: Home / Self Care | Payer: Medicaid Other | Source: Ambulatory Visit | Attending: Obstetrics and Gynecology | Admitting: Obstetrics and Gynecology

## 2011-03-31 LAB — CBC
HCT: 33.3 % — ABNORMAL LOW (ref 36.0–46.0)
MCH: 28.1 pg (ref 26.0–34.0)
MCV: 85.2 fL (ref 78.0–100.0)
Platelets: 144 10*3/uL — ABNORMAL LOW (ref 150–400)
RDW: 15.4 % (ref 11.5–15.5)
WBC: 7.4 10*3/uL (ref 4.0–10.5)

## 2011-03-31 LAB — SURGICAL PCR SCREEN: Staphylococcus aureus: NEGATIVE

## 2011-03-31 NOTE — Patient Instructions (Addendum)
   Your procedure is scheduled on: Thursday March 7th  Enter through the Main Entrance of [facility] at: 10:30am Pick up the phone at the desk and dial [phone] and inform us of your arrival.  Please call this number if you have any problems the morning of surgery: [phone]  Remember: Do not eat food after midnight:Wednesday Do not drink clear liquids after: Thursday at 8am Take these medicines the morning of surgery with a SIP OF WATER: none  Do not wear jewelry, make-up, or FINGER nail polish Do not wear lotions, powders, perfumes or deodorant. Do not shave 48 hours prior to surgery. Do not bring valuables to the hospital.   Leave suitcase in the car. After Surgery it may be brought to your room. For patients being admitted to the hospital, checkout time is 11:00am the day of discharge.      Remember to use your hibiclens as instructed.Please shower with 1/2 bottle the evening before your surgery and the other 1/2 bottle the morning of surgery. Neck down avoiding private area.

## 2011-03-31 NOTE — Pre-Procedure Instructions (Signed)
Platelets 144-Dr Rodman Pickle aware-no orders

## 2011-04-06 MED ORDER — CEFAZOLIN SODIUM-DEXTROSE 2-3 GM-% IV SOLR
2.0000 g | INTRAVENOUS | Status: AC
  Administered 2011-04-07: 2 g via INTRAVENOUS
  Filled 2011-04-06: qty 50

## 2011-04-07 ENCOUNTER — Encounter (HOSPITAL_COMMUNITY): Payer: Self-pay

## 2011-04-07 ENCOUNTER — Inpatient Hospital Stay (HOSPITAL_COMMUNITY)
Admission: RE | Admit: 2011-04-07 | Discharge: 2011-04-10 | DRG: 766 | Disposition: A | Discharge status: Home / Self Care | Payer: Medicaid Other | Source: Ambulatory Visit | Attending: Obstetrics and Gynecology | Admitting: Obstetrics and Gynecology

## 2011-04-07 ENCOUNTER — Encounter (HOSPITAL_COMMUNITY)
Admission: RE | Disposition: A | Discharge status: Home / Self Care | Payer: Self-pay | Source: Ambulatory Visit | Attending: Obstetrics and Gynecology

## 2011-04-07 ENCOUNTER — Inpatient Hospital Stay (HOSPITAL_COMMUNITY): Payer: Medicaid Other

## 2011-04-07 ENCOUNTER — Encounter (HOSPITAL_COMMUNITY): Payer: Self-pay | Admitting: *Deleted

## 2011-04-07 DIAGNOSIS — Z01818 Encounter for other preprocedural examination: Secondary | ICD-10-CM

## 2011-04-07 DIAGNOSIS — Z01812 Encounter for preprocedural laboratory examination: Secondary | ICD-10-CM

## 2011-04-07 DIAGNOSIS — O34219 Maternal care for unspecified type scar from previous cesarean delivery: Principal | ICD-10-CM | POA: Diagnosis present

## 2011-04-07 DIAGNOSIS — Z9889 Other specified postprocedural states: Secondary | ICD-10-CM

## 2011-04-07 HISTORY — DX: Other seasonal allergic rhinitis: J30.2

## 2011-04-07 LAB — TYPE AND SCREEN

## 2011-04-07 SURGERY — Surgical Case
Anesthesia: Spinal | Site: Abdomen | Wound class: Clean Contaminated

## 2011-04-07 MED ORDER — LANOLIN HYDROUS EX OINT: 1.0000 "application " | TOPICAL_OINTMENT | CUTANEOUS | Status: DC | PRN

## 2011-04-07 MED ORDER — NALBUPHINE HCL 10 MG/ML IJ SOLN
5.0000 mg | INTRAMUSCULAR | Status: DC | PRN
  Administered 2011-04-08: 10 mg via SUBCUTANEOUS
  Filled 2011-04-07: qty 1

## 2011-04-07 MED ORDER — EPHEDRINE 5 MG/ML INJ
INTRAVENOUS | Status: AC
  Filled 2011-04-07: qty 10

## 2011-04-07 MED ORDER — FENTANYL CITRATE 0.05 MG/ML IJ SOLN: 25.0000 ug | INTRAMUSCULAR | Status: DC | PRN

## 2011-04-07 MED ORDER — MORPHINE SULFATE 0.5 MG/ML IJ SOLN
INTRAMUSCULAR | Status: AC
  Filled 2011-04-07: qty 10

## 2011-04-07 MED ORDER — SENNOSIDES-DOCUSATE SODIUM 8.6-50 MG PO TABS
2.0000 | ORAL_TABLET | Freq: Every day | ORAL | Status: DC
  Administered 2011-04-07 – 2011-04-09 (×3): 2 via ORAL

## 2011-04-07 MED ORDER — LACTATED RINGERS IV SOLN
INTRAVENOUS | Status: DC
  Administered 2011-04-07 (×2): via INTRAVENOUS
  Administered 2011-04-07: 125 mL/h via INTRAVENOUS
  Administered 2011-04-07 (×2): via INTRAVENOUS

## 2011-04-07 MED ORDER — MENTHOL 3 MG MT LOZG: 1.0000 | LOZENGE | OROMUCOSAL | Status: DC | PRN

## 2011-04-07 MED ORDER — PHENYLEPHRINE 40 MCG/ML (10ML) SYRINGE FOR IV PUSH (FOR BLOOD PRESSURE SUPPORT)
PREFILLED_SYRINGE | INTRAVENOUS | Status: AC
  Filled 2011-04-07: qty 15

## 2011-04-07 MED ORDER — PHENYLEPHRINE HCL 10 MG/ML IJ SOLN
INTRAMUSCULAR | Status: DC | PRN
  Administered 2011-04-07 (×2): 80 ug via INTRAVENOUS
  Administered 2011-04-07: 40 ug via INTRAVENOUS

## 2011-04-07 MED ORDER — ZOLPIDEM TARTRATE 5 MG PO TABS: 5.0000 mg | ORAL_TABLET | Freq: Every evening | ORAL | Status: DC | PRN

## 2011-04-07 MED ORDER — ACETAMINOPHEN 325 MG PO TABS: 325.0000 mg | ORAL_TABLET | ORAL | Status: DC | PRN

## 2011-04-07 MED ORDER — IBUPROFEN 600 MG PO TABS
600.0000 mg | ORAL_TABLET | Freq: Four times a day (QID) | ORAL | Status: DC
  Administered 2011-04-07 – 2011-04-10 (×11): 600 mg via ORAL
  Filled 2011-04-07 (×11): qty 1

## 2011-04-07 MED ORDER — ONDANSETRON HCL 4 MG/2ML IJ SOLN
INTRAMUSCULAR | Status: DC | PRN
  Administered 2011-04-07: 4 mg via INTRAVENOUS

## 2011-04-07 MED ORDER — DIBUCAINE 1 % RE OINT: 1.0000 "application " | TOPICAL_OINTMENT | RECTAL | Status: DC | PRN

## 2011-04-07 MED ORDER — WITCH HAZEL-GLYCERIN EX PADS: 1.0000 "application " | MEDICATED_PAD | CUTANEOUS | Status: DC | PRN

## 2011-04-07 MED ORDER — PRENATAL MULTIVITAMIN CH
1.0000 | ORAL_TABLET | Freq: Every day | ORAL | Status: DC
  Administered 2011-04-08: 1 via ORAL
  Filled 2011-04-07 (×3): qty 1

## 2011-04-07 MED ORDER — MORPHINE SULFATE (PF) 0.5 MG/ML IJ SOLN
INTRAMUSCULAR | Status: DC | PRN
  Administered 2011-04-07: .1 mg via INTRATHECAL

## 2011-04-07 MED ORDER — OXYCODONE-ACETAMINOPHEN 5-325 MG PO TABS
1.0000 | ORAL_TABLET | ORAL | Status: DC | PRN
  Administered 2011-04-07 – 2011-04-10 (×14): 1 via ORAL
  Filled 2011-04-07 (×14): qty 1

## 2011-04-07 MED ORDER — BUPIVACAINE IN DEXTROSE 0.75-8.25 % IT SOLN
INTRATHECAL | Status: DC | PRN
  Administered 2011-04-07: 12 mg via INTRATHECAL

## 2011-04-07 MED ORDER — DIPHENHYDRAMINE HCL 50 MG/ML IJ SOLN
12.5000 mg | INTRAMUSCULAR | Status: DC | PRN
  Administered 2011-04-07: 12.5 mg via INTRAVENOUS
  Filled 2011-04-07: qty 1

## 2011-04-07 MED ORDER — OXYTOCIN 20 UNITS IN LACTATED RINGERS INFUSION - SIMPLE: 125.0000 mL/h | INTRAVENOUS | Status: AC

## 2011-04-07 MED ORDER — ONDANSETRON HCL 4 MG/2ML IJ SOLN: 4.0000 mg | Freq: Three times a day (TID) | INTRAMUSCULAR | Status: DC | PRN

## 2011-04-07 MED ORDER — SODIUM CHLORIDE 0.9 % IJ SOLN: 3.0000 mL | INTRAMUSCULAR | Status: DC | PRN

## 2011-04-07 MED ORDER — SCOPOLAMINE 1 MG/3DAYS TD PT72
MEDICATED_PATCH | TRANSDERMAL | Status: AC
  Administered 2011-04-07: 1.5 mg via TRANSDERMAL
  Filled 2011-04-07: qty 1

## 2011-04-07 MED ORDER — METOCLOPRAMIDE HCL 5 MG/ML IJ SOLN: 10.0000 mg | Freq: Three times a day (TID) | INTRAMUSCULAR | Status: DC | PRN

## 2011-04-07 MED ORDER — LACTATED RINGERS IV SOLN
INTRAVENOUS | Status: DC
  Administered 2011-04-07 – 2011-04-08 (×2): via INTRAVENOUS

## 2011-04-07 MED ORDER — ONDANSETRON HCL 4 MG PO TABS
4.0000 mg | ORAL_TABLET | ORAL | Status: DC | PRN
  Filled 2011-04-07 (×2): qty 1

## 2011-04-07 MED ORDER — NALBUPHINE HCL 10 MG/ML IJ SOLN
5.0000 mg | INTRAMUSCULAR | Status: DC | PRN
  Filled 2011-04-07: qty 1

## 2011-04-07 MED ORDER — OXYTOCIN 10 UNIT/ML IJ SOLN
INTRAMUSCULAR | Status: AC
  Filled 2011-04-07: qty 4

## 2011-04-07 MED ORDER — DIPHENHYDRAMINE HCL 25 MG PO CAPS: 25.0000 mg | ORAL_CAPSULE | Freq: Four times a day (QID) | ORAL | Status: DC | PRN

## 2011-04-07 MED ORDER — ACETAMINOPHEN 10 MG/ML IV SOLN: 1000.0000 mg | Freq: Four times a day (QID) | INTRAVENOUS | Status: AC | PRN

## 2011-04-07 MED ORDER — NALOXONE HCL 0.4 MG/ML IJ SOLN: 0.4000 mg | INTRAMUSCULAR | Status: DC | PRN

## 2011-04-07 MED ORDER — PHENYLEPHRINE 40 MCG/ML (10ML) SYRINGE FOR IV PUSH (FOR BLOOD PRESSURE SUPPORT)
PREFILLED_SYRINGE | INTRAVENOUS | Status: AC
  Filled 2011-04-07: qty 5

## 2011-04-07 MED ORDER — KETOROLAC TROMETHAMINE 30 MG/ML IJ SOLN: 30.0000 mg | Freq: Four times a day (QID) | INTRAMUSCULAR | Status: AC | PRN

## 2011-04-07 MED ORDER — IBUPROFEN 600 MG PO TABS: 600.0000 mg | ORAL_TABLET | Freq: Four times a day (QID) | ORAL | Status: DC | PRN

## 2011-04-07 MED ORDER — EPHEDRINE SULFATE 50 MG/ML IJ SOLN
INTRAMUSCULAR | Status: DC | PRN
  Administered 2011-04-07 (×6): 10 mg via INTRAVENOUS

## 2011-04-07 MED ORDER — FENTANYL CITRATE 0.05 MG/ML IJ SOLN
INTRAMUSCULAR | Status: DC | PRN
  Administered 2011-04-07: 25 ug via INTRATHECAL

## 2011-04-07 MED ORDER — SODIUM CHLORIDE 0.9 % IV SOLN: 1.0000 ug/kg/h | INTRAVENOUS | Status: DC | PRN

## 2011-04-07 MED ORDER — KETOROLAC TROMETHAMINE 30 MG/ML IJ SOLN
30.0000 mg | Freq: Four times a day (QID) | INTRAMUSCULAR | Status: AC | PRN
  Administered 2011-04-07: 30 mg via INTRAMUSCULAR

## 2011-04-07 MED ORDER — ONDANSETRON HCL 4 MG/2ML IJ SOLN: 4.0000 mg | INTRAMUSCULAR | Status: DC | PRN

## 2011-04-07 MED ORDER — KETOROLAC TROMETHAMINE 30 MG/ML IJ SOLN
INTRAMUSCULAR | Status: AC
  Administered 2011-04-07: 30 mg via INTRAMUSCULAR
  Filled 2011-04-07: qty 1

## 2011-04-07 MED ORDER — MEPERIDINE HCL 25 MG/ML IJ SOLN: 6.2500 mg | INTRAMUSCULAR | Status: DC | PRN

## 2011-04-07 MED ORDER — ONDANSETRON HCL 4 MG/2ML IJ SOLN
INTRAMUSCULAR | Status: AC
  Filled 2011-04-07: qty 2

## 2011-04-07 MED ORDER — SCOPOLAMINE 1 MG/3DAYS TD PT72: 1.0000 | MEDICATED_PATCH | Freq: Once | TRANSDERMAL | Status: DC

## 2011-04-07 MED ORDER — OXYCODONE-ACETAMINOPHEN 5-325 MG PO TABS: 1.0000 | ORAL_TABLET | ORAL | Status: DC | PRN

## 2011-04-07 MED ORDER — OXYTOCIN 20 UNITS IN LACTATED RINGERS INFUSION - SIMPLE
INTRAVENOUS | Status: DC | PRN
  Administered 2011-04-07 (×2): 20 [IU] via INTRAVENOUS

## 2011-04-07 MED ORDER — TETANUS-DIPHTH-ACELL PERTUSSIS 5-2.5-18.5 LF-MCG/0.5 IM SUSP: 0.5000 mL | Freq: Once | INTRAMUSCULAR | Status: DC

## 2011-04-07 MED ORDER — DIPHENHYDRAMINE HCL 50 MG/ML IJ SOLN: 25.0000 mg | INTRAMUSCULAR | Status: DC | PRN

## 2011-04-07 MED ORDER — FENTANYL CITRATE 0.05 MG/ML IJ SOLN
INTRAMUSCULAR | Status: AC
  Filled 2011-04-07: qty 2

## 2011-04-07 MED ORDER — SIMETHICONE 80 MG PO CHEW
80.0000 mg | CHEWABLE_TABLET | Freq: Three times a day (TID) | ORAL | Status: DC
  Administered 2011-04-07 – 2011-04-10 (×9): 80 mg via ORAL

## 2011-04-07 MED ORDER — DIPHENHYDRAMINE HCL 25 MG PO CAPS: 25.0000 mg | ORAL_CAPSULE | ORAL | Status: DC | PRN

## 2011-04-07 MED ORDER — SCOPOLAMINE 1 MG/3DAYS TD PT72
1.0000 | MEDICATED_PATCH | TRANSDERMAL | Status: DC
  Administered 2011-04-07: 1.5 mg via TRANSDERMAL

## 2011-04-07 MED ORDER — SIMETHICONE 80 MG PO CHEW: 80.0000 mg | CHEWABLE_TABLET | ORAL | Status: DC | PRN

## 2011-04-07 SURGICAL SUPPLY — 33 items
BARRIER ADHS 3X4 INTERCEED (GAUZE/BANDAGES/DRESSINGS) ×1 IMPLANT
BRR ADH 4X3 ABS CNTRL BYND (GAUZE/BANDAGES/DRESSINGS) ×1
CHLORAPREP W/TINT 26ML (MISCELLANEOUS) ×2 IMPLANT
CLOTH BEACON ORANGE TIMEOUT ST (SAFETY) ×2 IMPLANT
DRESSING TELFA 8X3 (GAUZE/BANDAGES/DRESSINGS) ×3 IMPLANT
DRSG PAD ABDOMINAL 8X10 ST (GAUZE/BANDAGES/DRESSINGS) ×1 IMPLANT
ELECT REM PT RETURN 9FT ADLT (ELECTROSURGICAL) ×2
ELECTRODE REM PT RTRN 9FT ADLT (ELECTROSURGICAL) ×1 IMPLANT
EXTRACTOR VACUUM M CUP 4 TUBE (SUCTIONS) ×1 IMPLANT
GAUZE SPONGE 4X4 12PLY STRL LF (GAUZE/BANDAGES/DRESSINGS) ×4 IMPLANT
GLOVE BIO SURGEON STRL SZ 6.5 (GLOVE) ×2 IMPLANT
GLOVE BIOGEL PI IND STRL 6.5 (GLOVE) ×2 IMPLANT
GLOVE BIOGEL PI INDICATOR 6.5 (GLOVE) ×2
GOWN PREVENTION PLUS LG XLONG (DISPOSABLE) ×4 IMPLANT
KIT ABG SYR 3ML LUER SLIP (SYRINGE) ×1 IMPLANT
NDL HYPO 25X5/8 SAFETYGLIDE (NEEDLE) ×1 IMPLANT
NEEDLE HYPO 22GX1.5 SAFETY (NEEDLE) ×1 IMPLANT
NEEDLE HYPO 25X5/8 SAFETYGLIDE (NEEDLE) IMPLANT
NS IRRIG 1000ML POUR BTL (IV SOLUTION) ×2 IMPLANT
PACK C SECTION WH (CUSTOM PROCEDURE TRAY) ×2 IMPLANT
PAD ABD 7.5X8 STRL (GAUZE/BANDAGES/DRESSINGS) ×1 IMPLANT
RTRCTR C-SECT PINK 25CM LRG (MISCELLANEOUS) IMPLANT
SLEEVE SCD COMPRESS KNEE MED (MISCELLANEOUS) IMPLANT
SPONGE GAUZE 4X4 12PLY (GAUZE/BANDAGES/DRESSINGS) ×1 IMPLANT
STAPLER VISISTAT 35W (STAPLE) ×1 IMPLANT
SUT CHROMIC 2 0 CT 1 (SUTURE) ×1 IMPLANT
SUT MON AB 4-0 PS1 27 (SUTURE) IMPLANT
SUT VIC AB 0 CTX 36 (SUTURE) ×12
SUT VIC AB 0 CTX36XBRD ANBCTRL (SUTURE) ×3 IMPLANT
TAPE CLOTH SURG 4X10 WHT LF (GAUZE/BANDAGES/DRESSINGS) ×1 IMPLANT
TOWEL OR 17X24 6PK STRL BLUE (TOWEL DISPOSABLE) ×4 IMPLANT
TRAY FOLEY CATH 14FR (SET/KITS/TRAYS/PACK) ×2 IMPLANT
WATER STERILE IRR 1000ML POUR (IV SOLUTION) ×1 IMPLANT

## 2011-04-07 NOTE — Anesthesia Procedure Notes (Signed)
Spinal  Patient location during procedure: OR Start time: 04/07/2011 12:19 PM Staffing Anesthesiologist: Brayton Caves R Performed by: anesthesiologist  Preanesthetic Checklist Completed: patient identified, site marked, surgical consent, pre-op evaluation, timeout performed, IV checked, risks and benefits discussed and monitors and equipment checked Spinal Block Patient position: sitting Prep: DuraPrep Patient monitoring: heart rate, cardiac monitor, continuous pulse ox and blood pressure Approach: midline Location: L3-4 Injection technique: single-shot Needle Needle type: Sprotte  Needle gauge: 24 G Needle length: 9 cm Assessment Sensory level: T4 Additional Notes Patient identified.  Risk benefits discussed including failed block, incomplete pain control, headache, nerve damage, paralysis, blood pressure changes, nausea, vomiting, reactions to medication both toxic or allergic, and postpartum back pain.  Patient expressed understanding and wished to proceed.  All questions were answered.  Sterile technique used throughout procedure.  CSF was clear.  No parasthesia or other complications.  Please see nursing notes for vital signs.

## 2011-04-07 NOTE — Anesthesia Preprocedure Evaluation (Signed)
Anesthesia Evaluation  Patient identified by MRN, date of birth, ID band Patient awake    Reviewed: Allergy & Precautions, H&P , NPO status , Patient's Chart, lab work & pertinent test results  Airway Mallampati: III      Dental No notable dental hx.    Pulmonary neg pulmonary ROS,  breath sounds clear to auscultation  Pulmonary exam normal       Cardiovascular Exercise Tolerance: Good negative cardio ROS  Rhythm:regular Rate:Normal     Neuro/Psych negative neurological ROS  negative psych ROS   GI/Hepatic negative GI ROS, Neg liver ROS,   Endo/Other  negative endocrine ROSMorbid obesity  Renal/GU negative Renal ROS  negative genitourinary   Musculoskeletal   Abdominal Normal abdominal exam  (+)   Peds  Hematology negative hematology ROS (+)   Anesthesia Other Findings   Reproductive/Obstetrics (+) Pregnancy                           Anesthesia Physical Anesthesia Plan  ASA: III  Anesthesia Plan: Spinal   Post-op Pain Management:    Induction:   Airway Management Planned:   Additional Equipment:   Intra-op Plan:   Post-operative Plan:   Informed Consent: I have reviewed the patients History and Physical, chart, labs and discussed the procedure including the risks, benefits and alternatives for the proposed anesthesia with the patient or authorized representative who has indicated his/her understanding and acceptance.     Plan Discussed with: Anesthesiologist, CRNA and Surgeon  Anesthesia Plan Comments:         Anesthesia Quick Evaluation  

## 2011-04-07 NOTE — Op Note (Signed)
Preop: repeat c/s x3 Postop: same Procedure: LTCS Surgeon: Delray Alt Assist: S. Bowie Anesthesia: epidural Fluids: 4200cc EBL 800 UOP 150 Specimen: cord blood Findings: Abundant dense scar tissue plastering anterior uterus to anterior abdominal wall.  Nl tubes and ovaries b/l, female infant in cephalic presentation, APGARs 4/09, wt not yet available. Complications: none Condition; stable to PACU

## 2011-04-07 NOTE — Consult Note (Signed)
Requested to attend term repeat C/S under spinal anesthesia.  Infant delivered from vertex presentation after getting through the scar tissue.  Spontaneous cries and good tone at birth.  Given vigorous tactile stimulation and bulb suction of naso/oropharynx. No void nor stool. No dysmorphic features. Apgar 9/10 at open and five minutes.  Shown to mother and MGM and care transferred to Transitional RN and to assigned pediatrician.   I have personally assessed this infant and have been physically present and directed the development and the implementation of the collaborative plan of care as reflected in the daily progress and/or procedure notes composed by the C-NNP    Braeton Wolgamott. Alphonsa Gin MD Attending Neonatologist

## 2011-04-07 NOTE — H&P (Signed)
36 y.o. [redacted]w[redacted]d  [id number] presents for scheduled repeat c/s x3.  Otherwise has good fetal movement and no bleeding.  Past Medical History  Diagnosis Date  . Anemia   . Seasonal allergies     Past Surgical History  Procedure Date  . Cesarean section [id number]  . Dilation and curettage of uterus 04/2010    missed ab    OB History    Grav Para Term Preterm Abortions TAB SAB Ect Mult Living   6 2 2  0 3 1 2  0 0 2     # Outc Date GA Lbr Len/2nd Wgt Sex Del Anes PTL Lv   1 TRM 2000    F CS   Yes   2 TAB 2005           3 TRM 2007    F CS   Yes   4 SAB 8/11           5 SAB 3/12           6 CUR               History   Social History  . Marital Status: Single    Spouse Name: N/A    Number of Children: N/A  . Years of Education: N/A   Occupational History  . Not on file.   Social History Main Topics  . Smoking status: Never Smoker   . Smokeless tobacco: Never Used  . Alcohol Use: No  . Drug Use: No  . Sexually Active: Yes     pregnant   Other Topics Concern  . Not on file   Social History Narrative  . No narrative on file   Review of patient's allergies indicates no known allergies.   Prenatal Course:  MOS, hx of back problem with prior epidural; A+, RI, 1hr gct 107  There were no vitals filed for this visit.   Lungs/Cor:  NAD Abdomen:  soft, gravid Ex:  no cords, erythema SVE:  Cl 03/24/11    A/P   Proceed with c/s x 3 ( declined BTL) 1) hx of anemia - latest 12/11 Hb =11.0  GBS Neg 2) MOS - recent US for s>d 03/24/11: EFW 5#13 ( 30.3%), vtx, anterior placenta, nl AFI=13  Kynsleigh Westendorf

## 2011-04-07 NOTE — Anesthesia Postprocedure Evaluation (Signed)
Anesthesia Post Note  Patient: Rachel Wu  Procedure(s) Performed: Procedure(s) (LRB): CESAREAN SECTION (N/A)  Anesthesia type: Spinal  Patient location: PACU  Post pain: Pain level controlled  Post assessment: Post-op Vital signs reviewed  Last Vitals:  Filed Vitals:   04/07/11 1440  BP:   Pulse:   Temp: 36.7 C  Resp: 12    Post vital signs: Reviewed  Level of consciousness: awake  Complications: No apparent anesthesia complications

## 2011-04-07 NOTE — Transfer of Care (Signed)
Immediate Anesthesia Transfer of Care Note  Patient: Rachel Wu  Procedure(s) Performed: Procedure(s) (LRB): CESAREAN SECTION (N/A)  Patient Location: PACU  Anesthesia Type: Spinal  Level of Consciousness: awake, alert  and oriented  Airway & Oxygen Therapy: Patient Spontanous Breathing  Post-op Assessment: Report given to PACU RN and Post -op Vital signs reviewed and stable  Post vital signs: Reviewed and stable  Complications: No apparent anesthesia complications

## 2011-04-07 NOTE — Progress Notes (Signed)
Dr Dareen Piano called re pain level of 5 in patient who had C/S today at 1306. Toradol is ordered q6hrs and pt had toradol at 1515 in PACU. Med orders recieved from Dr. Dareen Piano who is on call.

## 2011-04-08 LAB — CBC
Platelets: 127 10*3/uL — ABNORMAL LOW (ref 150–400)
RBC: 3.33 MIL/uL — ABNORMAL LOW (ref 3.87–5.11)
WBC: 6.7 10*3/uL (ref 4.0–10.5)

## 2011-04-08 LAB — RPR: RPR Ser Ql: NONREACTIVE

## 2011-04-08 MED ORDER — BISACODYL 5 MG PO TBEC
5.0000 mg | DELAYED_RELEASE_TABLET | Freq: Every day | ORAL | Status: DC | PRN
  Administered 2011-04-08: 5 mg via ORAL
  Filled 2011-04-08: qty 1

## 2011-04-08 NOTE — Progress Notes (Signed)
UR Chart review completed.  

## 2011-04-08 NOTE — Addendum Note (Signed)
Addendum  created 04/08/11 0815 by Madison Hickman, CRNA   Modules edited:Notes Section

## 2011-04-08 NOTE — Progress Notes (Signed)
  Patient is eating, ambulating, voiding.  Pain control is good.  Filed Vitals:   04/07/11 2101 04/07/11 2334 04/08/11 0130 04/08/11 0530  BP: 102/68 94/64 110/74 94/62  Pulse: 101 64 105 102  Temp:  98.7 F (37.1 C) 98.1 F (36.7 C) 98.7 F (37.1 C)  TempSrc:  Oral Oral Oral  Resp: 20 20 20 20   Weight:      SpO2:  97% 97% 98%    lungs:   clear to auscultation cor:    RRR Abdomen:  soft, appropriate tenderness, incisions intact and without erythema or exudate ex:    no cords   Lab Results  Component Value Date   WBC 6.7 04/08/2011   HGB 9.4* 04/08/2011   HCT 28.2* 04/08/2011   MCV 84.7 04/08/2011   PLT 127* 04/08/2011    --/--/A POS (03/07 1030)/RI  A/P    Post operative day 1.  Routine post op and postpartum care.  Expect d/c tomorrow.  Percocet for pain control.

## 2011-04-08 NOTE — Anesthesia Postprocedure Evaluation (Signed)
  Anesthesia Post-op Note  Patient: Rachel Wu  Procedure(s) Performed: Procedure(s) (LRB): CESAREAN SECTION (N/A)  Patient Location: Mother/Baby  Anesthesia Type: Spinal  Level of Consciousness: awake, alert  and oriented  Airway and Oxygen Therapy: Patient Spontanous Breathing  Post-op Pain: none  Post-op Assessment: Post-op Vital signs reviewed, Patient's Cardiovascular Status Stable, No headache, No backache, No residual numbness and No residual motor weakness  Post-op Vital Signs: Reviewed and stable  Complications: No apparent anesthesia complications

## 2011-04-09 MED ORDER — FAMOTIDINE 20 MG PO TABS
20.0000 mg | ORAL_TABLET | Freq: Two times a day (BID) | ORAL | Status: DC
  Administered 2011-04-09 – 2011-04-10 (×4): 20 mg via ORAL
  Filled 2011-04-09 (×5): qty 1

## 2011-04-09 MED ORDER — ONDANSETRON HCL 4 MG PO TABS
8.0000 mg | ORAL_TABLET | Freq: Once | ORAL | Status: AC
  Administered 2011-04-09: 8 mg via ORAL

## 2011-04-09 MED ORDER — BISACODYL 10 MG RE SUPP
10.0000 mg | Freq: Every day | RECTAL | Status: DC | PRN
  Administered 2011-04-09: 10 mg via RECTAL
  Filled 2011-04-09: qty 1

## 2011-04-09 NOTE — Progress Notes (Signed)
  Patient is eating, ambulating, voiding.  Pain control is good.  Filed Vitals:   04/08/11 0900 04/08/11 1249 04/08/11 1932 04/09/11 0513  BP: 103/69 110/70 118/83 115/77  Pulse: 86 82 105 91  Temp: 97.9 F (36.6 C) 98 F (36.7 C) 99.8 F (37.7 C) 98.1 F (36.7 C)  TempSrc: Oral Oral Oral Oral  Resp: 20 20 18 18   Weight:      SpO2: 99%  98%     lungs:   clear to auscultation cor:    RRR Abdomen:  soft, appropriate tenderness, incisions intact and without erythema or exudate ex:    no cords   Lab Results  Component Value Date   WBC 6.7 04/08/2011   HGB 9.4* 04/08/2011   HCT 28.2* 04/08/2011   MCV 84.7 04/08/2011   PLT 127* 04/08/2011    --/--/A POS (03/07 1030)/RI  A/P    Post operative day 2.  Routine post op and postpartum care.  Expect d/c tomorrow.  Percocet and ibuprofen for pain control.  Had some N/V last night but better now.

## 2011-04-10 MED ORDER — OXYCODONE-ACETAMINOPHEN 5-325 MG PO TABS: 1.0000 | ORAL_TABLET | ORAL | Status: AC | PRN

## 2011-04-10 NOTE — Progress Notes (Signed)
  Patient is eating, ambulating, voiding.  Pain control is good.  Filed Vitals:   04/09/11 0513 04/09/11 1500 04/09/11 2131 04/10/11 0529  BP: 115/77 111/77 116/81 106/73  Pulse: 91 91 86 88  Temp: 98.1 F (36.7 C) 98.3 F (36.8 C) 98.6 F (37 C) 98.5 F (36.9 C)  TempSrc: Oral Oral Oral Oral  Resp: 18 19 18 18   Weight:      SpO2:        lungs:   clear to auscultation cor:    RRR Abdomen:  soft, appropriate tenderness, incisions intact and without erythema or exudate ex:    no cords   Lab Results  Component Value Date   WBC 6.7 04/08/2011   HGB 9.4* 04/08/2011   HCT 28.2* 04/08/2011   MCV 84.7 04/08/2011   PLT 127* 04/08/2011    --/--/A POS (03/07 1030)/RI  A/P    Post operative day 3.  Routine post op and postpartum care.  Expect d/c today.  Percocet for pain control.

## 2011-04-10 NOTE — Discharge Summary (Signed)
Obstetric Discharge Summary Reason for Admission: cesarean section Prenatal Procedures: none Intrapartum Procedures: cesarean: low cervical, transverse Postpartum Procedures: none Complications-Operative and Postpartum: none Hemoglobin  Date Value Range Status  04/08/2011 9.4* 12.0-15.0 (g/dL) Final     HCT  Date Value Range Status  04/08/2011 28.2* 36.0-46.0 (%) Final  Hospital Course:  Pt admitted for routine repeat c/s at term.  Pt underwent c/s without complication and had some postoperative gas pain, but otherwise postoperative course was uncomplicated.  Pt d/ced pod 3.  Discharge Diagnoses: Term Pregnancy-delivered  Discharge Information: Date: 04/10/2011 Activity: pelvic rest Diet: routine Medications: Percocet Condition: stable Instructions: refer to practice specific booklet Discharge to: home Follow-up Information    Follow up with CALLAHAN, SIDNEY, DO. Schedule an appointment as soon as possible for a visit in 4 weeks.   Contact information:   [address], [address] [facility] [place] [address] [phone]          Newborn Data: Live born female  Birth Weight: 6 lb 12.6 oz (3080 g) APGAR: 8, 10  Home with mother.  Marlana Mckowen A 04/10/2011, 6:30 AM

## 2011-04-11 ENCOUNTER — Encounter (HOSPITAL_COMMUNITY): Payer: Self-pay | Admitting: Obstetrics and Gynecology

## 2011-04-13 NOTE — Op Note (Signed)
NAME:  Rachel Wu, Rachel Wu NO.:  [id number]  MEDICAL RECORD NO.:  [id number]  LOCATION:  9130                          FACILITY:  [facility]  PHYSICIAN:  Philip Aspen, DO    DATE OF BIRTH:  1975/02/07  DATE OF PROCEDURE:  04/07/2011 DATE OF DISCHARGE:  04/10/2011                              OPERATIVE REPORT   PREOPERATIVE DIAGNOSIS:  Repeat cesarean section x3.  POSTOPERATIVE DIAGNOSIS:  Repeat cesarean section x3.  PROCEDURE:  Low transverse cesarean section.  SURGEON:  Philip Aspen, DO  ASSISTANT:  Luvenia Redden, M.D.  ANESTHESIA:  Epidural.  FLUIDS:  4200 mL.  ESTIMATED BLOOD LOSS:  800 mL.  URINE OUTPUT:  150 mL.  SPECIMENS:  Cord blood.  FINDINGS:  Abundant dense scar tissue passing anterior uterus to anterior abdominal wall.  Normal tubes and ovaries bilaterally.  Female infant in cephalic presentation with Apgars of 9 and 10.  Weight not available at time of dictation.  COMPLICATIONS:  None.  CONDITION:  Stable to PACU.  DESCRIPTION OF PROCEDURE:  The patient was taken to the operating room after consent, risks, and benefits were reviewed with the patient.  The patient was prepped and draped in normal sterile fashion in the dorsal supine position, had the Foley placed.  A Pfannenstiel skin incision was made with the scalpel and the incision carried down to the underlying fascia with the scalpel.  The fascia was then incised and extended laterally with Mayo scissors with prior scar tissue noted.  The inferior aspect of the fascia was then grasped with Kocher clamps and rectus muscles were dissected off bluntly and sharply.  In a similar fashion, superior aspect of the fascia was elevated with Kocher clamps and rectus muscles dissected off bluntly and sharply.  Significant scar tissue at the midline aspect of the rectus muscle made entrance difficult, however, Metzenbaum scissors and Bovie were used intermittently to separate the rectus  muscles and reveal the underlying peritoneum.  Mayo scissors were then used to extend the incision superiorly and a survey of the abdomen and pelvis reveals abundant scar tissue throughout connecting the anterior abdominal wall to the anterior uterus. Identification of the bladder was difficult secondary to scar tissue and slow and careful dissection was performed to avoid any injuries. Ultimately the intra-abdominal wall was detached from the anterior uterus and bladder blade was inserted.  A scalpel was used to create a low-transverse cesarean section and the uterine incision was extended by cephalic and caudal traction.  The infant's head was elevated from the pelvis until fundal pressure was applied.  The infant delivered without difficulty.  His nose and mouth were bulb suctioned.  Cord was clamped and cut and the infant was handed off to awaiting neonatology.  Cord blood was collected and the placenta was then removed by gentle traction.  All clots and debris were then removed and the uterine incision was repaired with 0 Vicryl in a running, locked fashion to the first layer.  Second layer was placed in horizontal Lambert technique. Due to the abundant scar tissue, additional defect was superior to the midline of low-transverse incision extending through multiple layers of the myometrium but not extending through to  the uterine cavity.  It was in a similar apposition as would be a classical incision.  This was repaired in 2 layers.  An additional figure-of-eight suture was placed for excellent hemostasis.  All clots and debris were removed from the abdomen and pelvis. Incision was reexamined and minimal use of Bovie cautery at serosal edges was used.  The uterus which had been exteriorized was returned to the abdomen and examined 1 more time.  The peritoneum was then reapproximated and closed with 4-0 Monocryl in a running fashion.  The fascia was then reapproximated and closed with  0 Vicryl and a running fashion.  Subcutaneous tissue was irrigated, dried, and found to be hemostatic with minimal use of Bovie cautery.  The skin was then reapproximated and closed with staples.  At the conclusion of the case, the patient who was awake was informed of the results of the case which was positive results, however, she was informed that subsequent C-sections could pose additional risk of complication due to the uterine incision and abundant scar tissue.  The patient did tolerate the procedure well.  Sponge, lap, and needle counts were correct x2 and the patient was taken to recovery in stable condition.          ______________________________ Philip Aspen, DO     [place]/MEDQ  D:  04/12/2011  T:  04/13/2011  Job:  [id number]

## 2011-12-21 IMAGING — US US OB COMP LESS 14 WK
1 series · 14 of 14 positions shown · non-contrast
Comparison: none

[Series 1: us ob comp less 14 wk · 0.20mm/px · 14 of 14 slices shown]
[im 1/14]
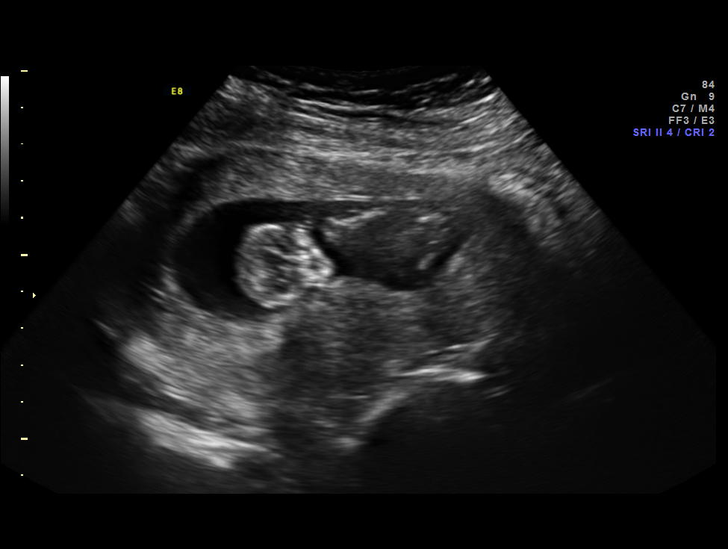
[im 2/14]
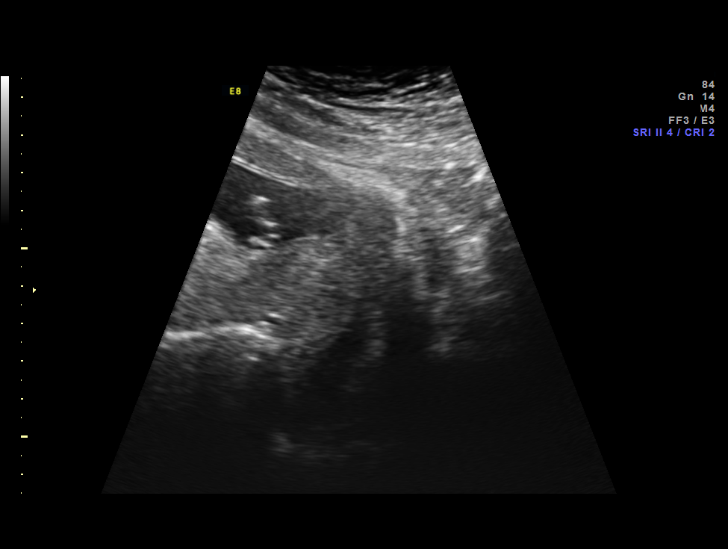
[im 3/14]
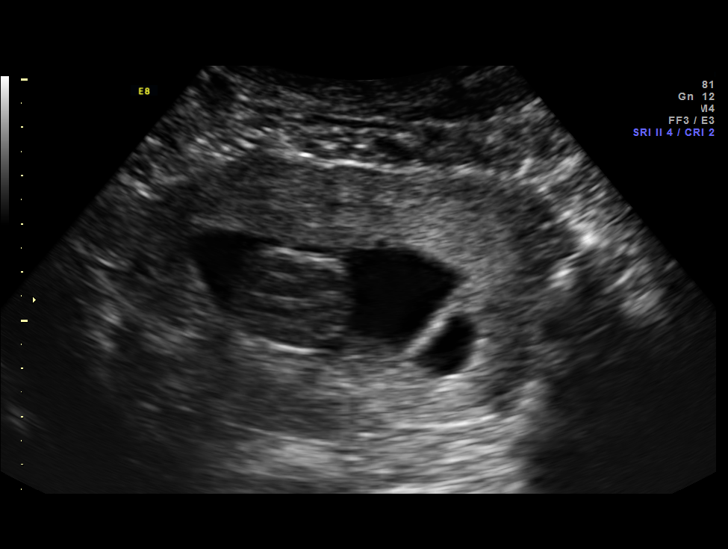
[im 4/14]
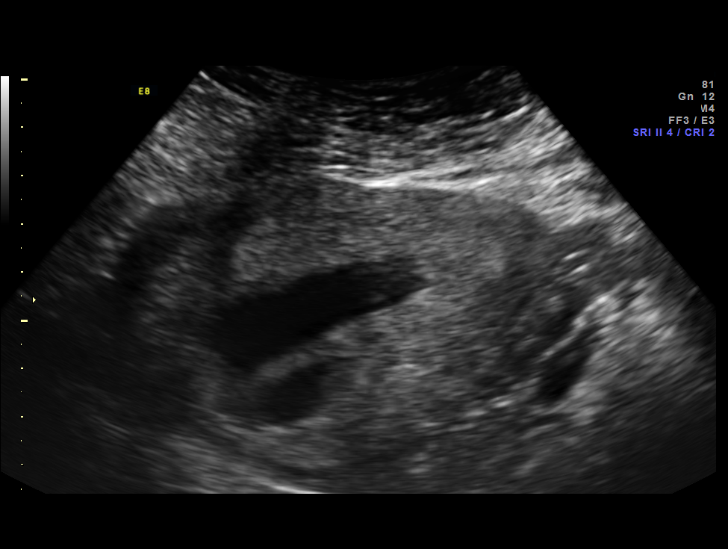
[im 5/14]
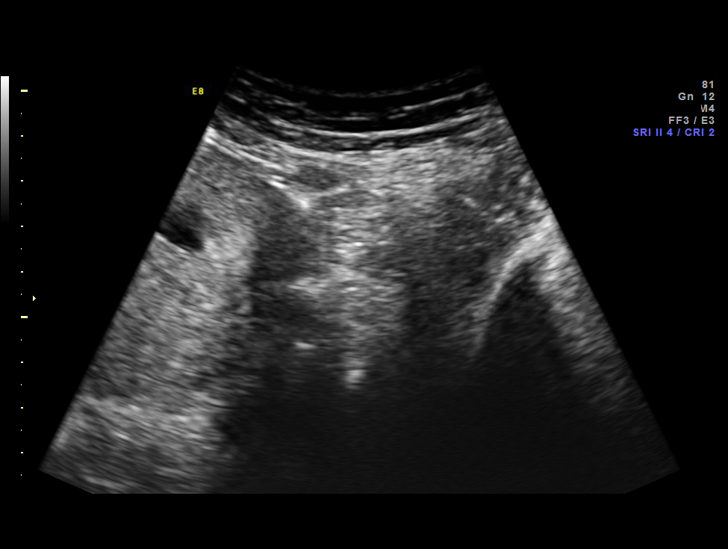
[im 6/14]
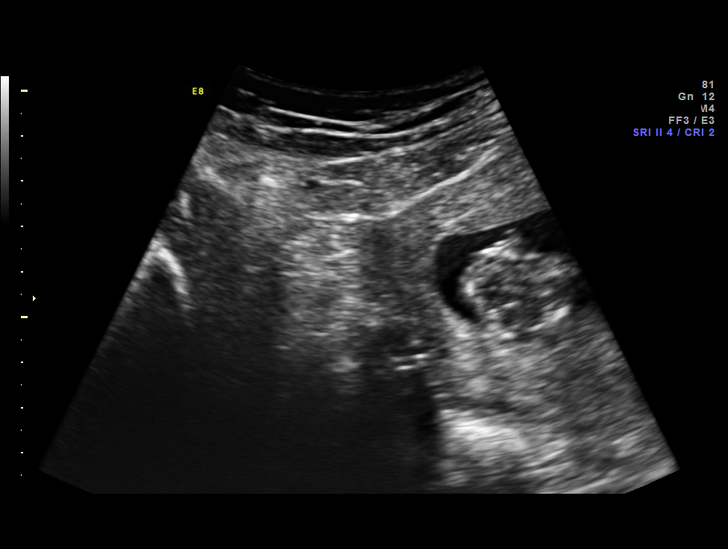
[im 7/14]
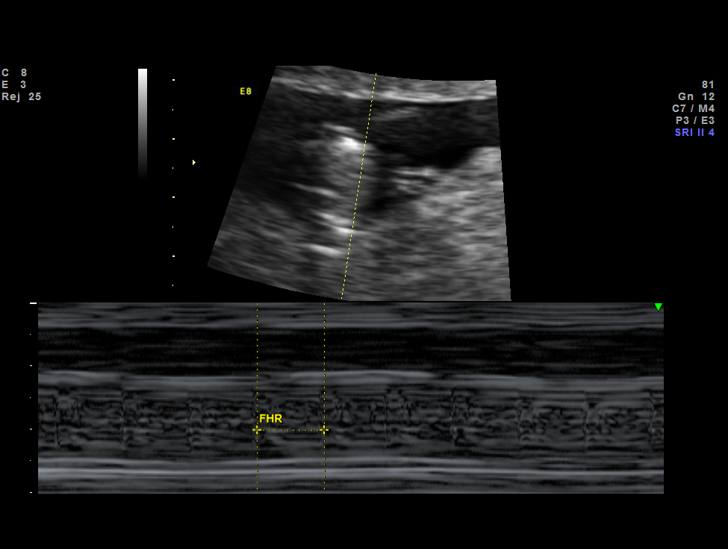
[im 8/14]
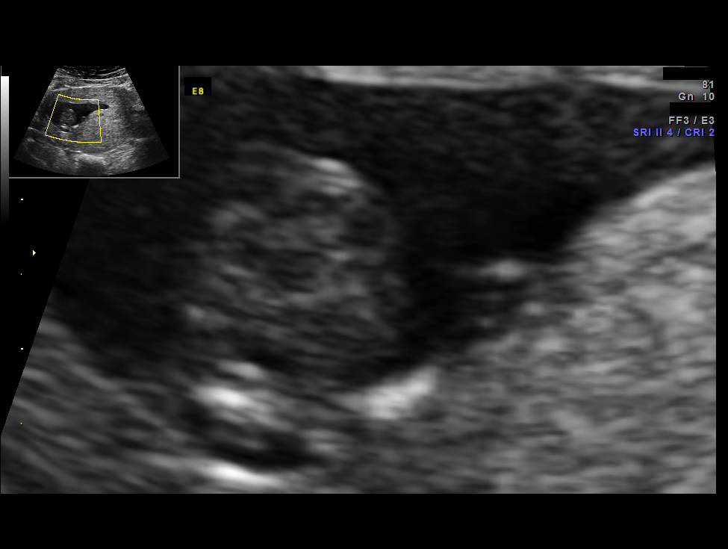
[im 9/14]
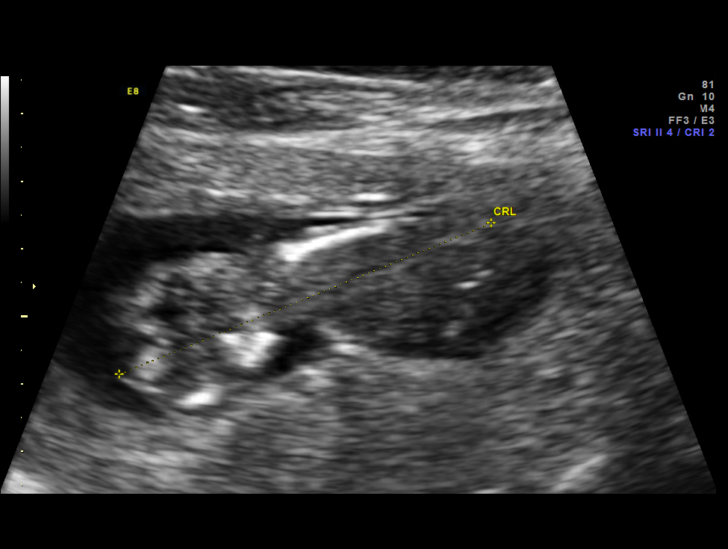
[im 10/14]
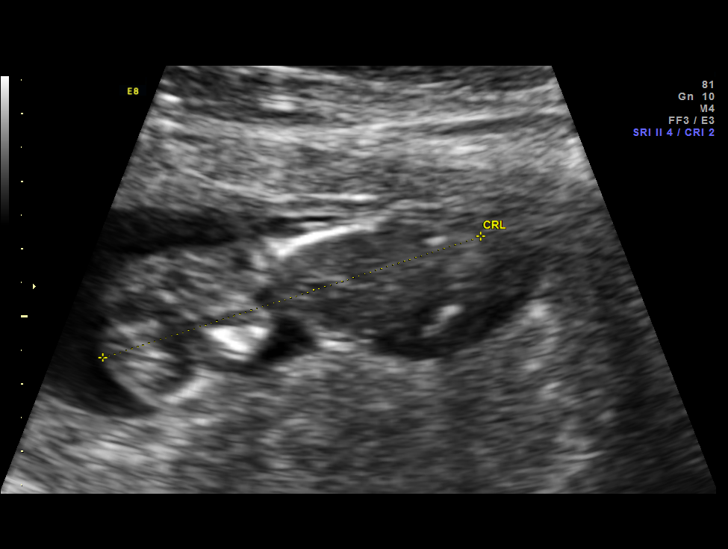
[im 11/14]
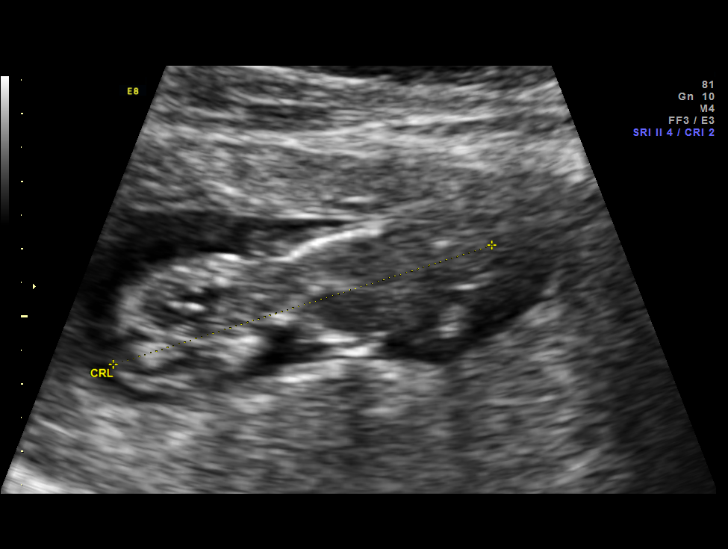
[im 12/14]
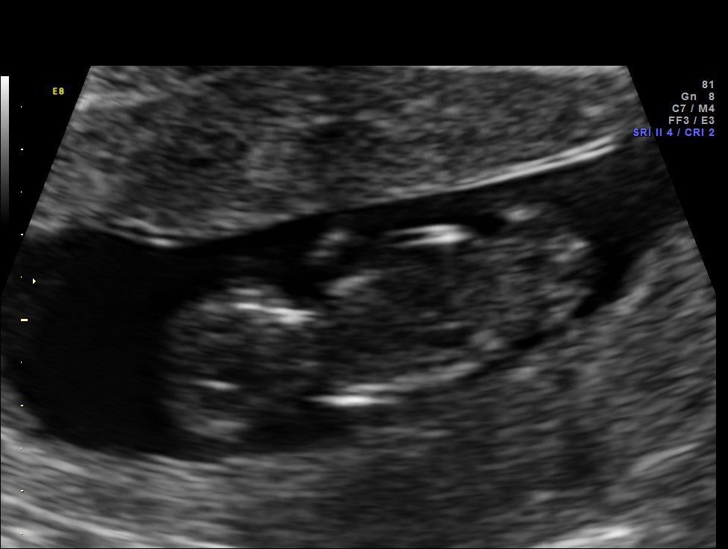
[im 13/14]
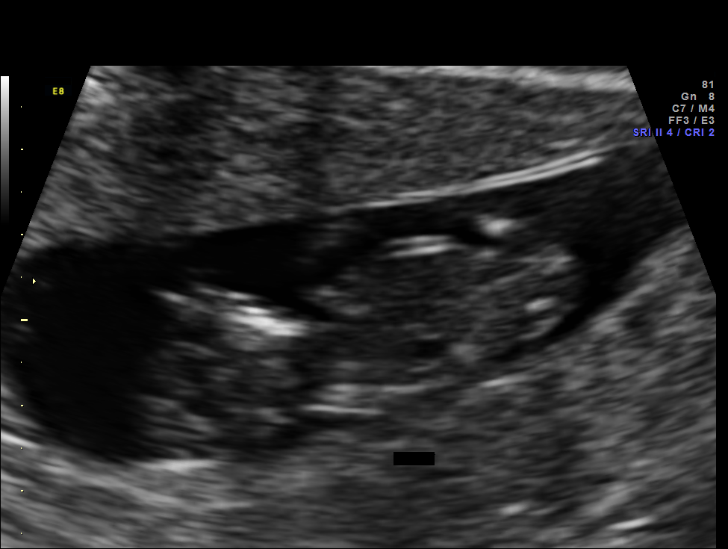
[im 14/14]
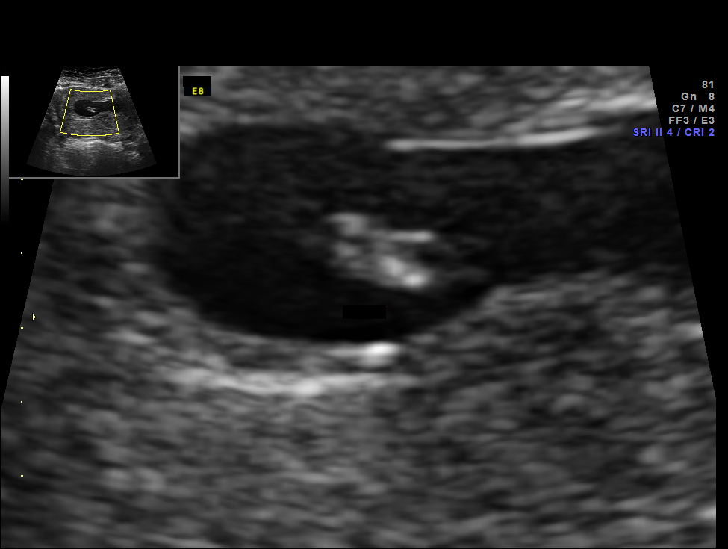

[14 of 14 positions shown; findings below may reference images not displayed]

Canned report from images found in remote index.

Refer to host system for actual result text.

## 2013-02-18 ENCOUNTER — Encounter: Payer: Self-pay | Admitting: Gastroenterology

## 2013-03-15 ENCOUNTER — Ambulatory Visit: Payer: Medicaid Other | Admitting: Gastroenterology

## 2013-03-27 ENCOUNTER — Ambulatory Visit (INDEPENDENT_AMBULATORY_CARE_PROVIDER_SITE_OTHER): Payer: Medicaid Other | Admitting: Gastroenterology

## 2013-03-27 ENCOUNTER — Encounter: Payer: Self-pay | Admitting: Gastroenterology

## 2013-03-27 VITALS — BP 138/72 | HR 88 | Ht 65.0 in | Wt 278.0 lb

## 2013-03-27 DIAGNOSIS — K8020 Calculus of gallbladder without cholecystitis without obstruction: Secondary | ICD-10-CM | POA: Insufficient documentation

## 2013-03-27 DIAGNOSIS — R1032 Left lower quadrant pain: Secondary | ICD-10-CM | POA: Insufficient documentation

## 2013-03-27 NOTE — Progress Notes (Signed)
    _                                                                                                                History of Present Illness: Rachel Wu 38 year old Afro-American female referred for evaluation of abdominal pain.  Approximately 6 weeks ago she developed left lower quadrant pain.  Pain lasted several hours.  She has been complaining of lower back pain.  She underwent abdominal ultrasound demonstrated gallstones.  Liver tests were normal.  Since that day she has had no further episodes of abdominal pain.  She denies change of bowel habits, pyrosis, nausea, melena or hematochezia.    Past Medical History  Diagnosis Date  . Anemia   . Seasonal allergies    Past Surgical History  Procedure Laterality Date  . Cesarean section  [id number]  . Dilation and curettage of uterus  04/2010    missed ab  . Cesarean section  04/07/2011    Procedure: CESAREAN SECTION;  Surgeon: Philip AspenSidney Callahan, DO;  Location: [facility];  Service: Gynecology;  Laterality: N/A;  Repeat 04/14/11   family history includes Diabetes in her father; Hypertension in her father and mother. Current Outpatient Prescriptions  Medication Sig Dispense Refill  . Calcium Carbonate Antacid (TUMS PO) Take 1 tablet by mouth daily.        Marland Kitchen. loratadine (CLARITIN) 10 MG tablet Take 1 tablet (10 mg total) by mouth daily.  30 tablet  0   No current facility-administered medications for this visit.   Allergies as of 03/27/2013  . (No Known Allergies)    reports that she has never smoked. She has never used smokeless tobacco. She reports that she does not drink alcohol or use illicit drugs.     Review of Systems: Pertinent positive and negative review of systems were noted in the above HPI section. All other review of systems were otherwise negative.  Vital signs were reviewed in today's medical record Physical Exam: General: Well developed , well nourished, no acute distress Skin: anicteric Head:  Normocephalic and atraumatic Eyes:  sclerae anicteric, EOMI Ears: Normal auditory acuity Mouth: No deformity or lesions Neck: Supple, no masses or thyromegaly Lungs: Clear throughout to auscultation Heart: Regular rate and rhythm; no murmurs, rubs or bruits Abdomen: Soft, non tender and non distended. No masses, hepatosplenomegaly or hernias noted. Normal Bowel sounds Rectal:deferred Musculoskeletal: Symmetrical with no gross deformities  Skin: No lesions on visible extremities Pulses:  Normal pulses noted Extremities: No clubbing, cyanosis, edema or deformities noted Neurological: Alert oriented x 4, grossly nonfocal Cervical Nodes:  No significant cervical adenopathy Inguinal Nodes: No significant inguinal adenopathy Psychological:  Alert and cooperative. Normal mood and affect  See Assessment and Plan under Problem List

## 2013-03-27 NOTE — Assessment & Plan Note (Signed)
Although she has gallstones, I do not think that she is symptomatic from gallbladder disease.  Recommend no further workup or therapy at this time

## 2013-03-27 NOTE — Assessment & Plan Note (Signed)
Etiology of abdominal pain is uncertain.  This is no longer an active problem.

## 2013-03-27 NOTE — Patient Instructions (Signed)
Follow up as needed

## 2013-12-02 ENCOUNTER — Encounter: Payer: Self-pay | Admitting: Gastroenterology

## 2014-03-09 ENCOUNTER — Inpatient Hospital Stay (HOSPITAL_COMMUNITY)
Admission: AD | Admit: 2014-03-09 | Discharge: 2014-03-09 | Disposition: A | Discharge status: Home / Self Care | Payer: Medicaid Other | Source: Ambulatory Visit | Attending: Family Medicine | Admitting: Family Medicine

## 2014-03-09 ENCOUNTER — Encounter (HOSPITAL_COMMUNITY): Payer: Self-pay

## 2014-03-09 ENCOUNTER — Inpatient Hospital Stay (HOSPITAL_COMMUNITY): Payer: Medicaid Other

## 2014-03-09 DIAGNOSIS — O200 Threatened abortion: Secondary | ICD-10-CM

## 2014-03-09 DIAGNOSIS — O209 Hemorrhage in early pregnancy, unspecified: Secondary | ICD-10-CM

## 2014-03-09 DIAGNOSIS — Z3A01 Less than 8 weeks gestation of pregnancy: Secondary | ICD-10-CM | POA: Insufficient documentation

## 2014-03-09 LAB — URINALYSIS, ROUTINE W REFLEX MICROSCOPIC
Bilirubin Urine: NEGATIVE
Glucose, UA: NEGATIVE mg/dL
Ketones, ur: NEGATIVE mg/dL
Leukocytes, UA: NEGATIVE
Nitrite: NEGATIVE
PROTEIN: NEGATIVE mg/dL
SPECIFIC GRAVITY, URINE: 1.02 (ref 1.005–1.030)
Urobilinogen, UA: 1 mg/dL (ref 0.0–1.0)
pH: 6.5 (ref 5.0–8.0)

## 2014-03-09 LAB — WET PREP, GENITAL
CLUE CELLS WET PREP: NONE SEEN
Trich, Wet Prep: NONE SEEN
YEAST WET PREP: NONE SEEN

## 2014-03-09 LAB — CBC
HCT: 33.8 % — ABNORMAL LOW (ref 36.0–46.0)
Hemoglobin: 11.2 g/dL — ABNORMAL LOW (ref 12.0–15.0)
MCH: 27.7 pg (ref 26.0–34.0)
MCHC: 33.1 g/dL (ref 30.0–36.0)
MCV: 83.7 fL (ref 78.0–100.0)
Platelets: 206 10*3/uL (ref 150–400)
RBC: 4.04 MIL/uL (ref 3.87–5.11)
RDW: 14.4 % (ref 11.5–15.5)
WBC: 7.7 10*3/uL (ref 4.0–10.5)

## 2014-03-09 LAB — URINE MICROSCOPIC-ADD ON

## 2014-03-09 LAB — HCG, QUANTITATIVE, PREGNANCY: hCG, Beta Chain, Quant, S: 22534 m[IU]/mL — ABNORMAL HIGH (ref <5)

## 2014-03-09 LAB — POCT PREGNANCY, URINE: Preg Test, Ur: POSITIVE — AB

## 2014-03-09 NOTE — MAU Provider Note (Signed)
History     CSN: [id number]  Arrival date and time: 03/09/14 1535   First Provider Initiated Contact with Patient 03/09/14 1628      No chief complaint on file.  HPI Rachel Wu 39 y.o. [id number] with positive HPT several days ago presents to MAU with vaginal bleeding that began yesterday.  She notes having sex 3 days ago.  She has had some cramping earlier but this is improved now.  The bleeding is more like spotting and it is a light red.  She denies dysuria, fever, weakness, nausea, vomiting, vaginal discharge or LOF.   OB History    Gravida Para Term Preterm AB TAB SAB Ectopic Multiple Living   0 0 0 3      Past Medical History  Diagnosis Date  . Anemia   . Seasonal allergies     Past Surgical History  Procedure Laterality Date  . Cesarean section  [id number]  . Dilation and curettage of uterus  04/2010    missed ab  . Cesarean section  04/07/2011    Procedure: CESAREAN SECTION;  Surgeon: Philip Aspen, DO;  Location: [facility];  Service: Gynecology;  Laterality: N/A;  Repeat 04/14/11    Family History  Problem Relation Age of Onset  . Hypertension Mother   . Diabetes Father   . Hypertension Father     History  Substance Use Topics  . Smoking status: Never Smoker   . Smokeless tobacco: Never Used  . Alcohol Use: No    Allergies: No Known Allergies  Prescriptions prior to admission  Medication Sig Dispense Refill Last Dose  . naproxen sodium (ANAPROX) 220 MG tablet Take 220 mg by mouth 2 (two) times daily as needed (pain).   Past Month at Unknown time  . loratadine (CLARITIN) 10 MG tablet Take 1 tablet (10 mg total) by mouth daily. (Patient not taking: Reported on 03/09/2014) 30 tablet 0 More than a month at Unknown    ROS Pertinent ROS in HPI  Physical Exam   Blood pressure 126/85, pulse 86, temperature 98.2 F (36.8 C), resp. rate 18, height  (1.676 m), weight 280 lb (127.007 kg), last menstrual period 01/04/2014, unknown if currently  breastfeeding.  Physical Exam  Constitutional: She is oriented to person, place, and time. She appears well-developed and well-nourished. No distress.  HENT:  Head: Normocephalic and atraumatic.  Eyes: EOM are normal.  Neck: Normal range of motion.  Cardiovascular: Normal rate, regular rhythm and normal heart sounds.   Respiratory: Effort normal and breath sounds normal. No respiratory distress.  GI: Soft. Bowel sounds are normal. She exhibits no distension. There is no tenderness. There is no rebound and no guarding.  Genitourinary:  Mod amt of brown colored discharge No CMT, no adnexal tenderness Difficult exam due to obese pannus  Musculoskeletal: Normal range of motion.  Neurological: She is alert and oriented to person, place, and time.  Skin: Skin is warm and dry.  Psychiatric: She has a normal mood and affect.   Results for orders placed or performed during the hospital encounter of 03/09/14 (from the past 24 hour(s))  Urinalysis, Routine w reflex microscopic     Status: Abnormal   Collection Time: 03/09/14  3:45 PM  Result Value Ref Range   Color, Urine YELLOW YELLOW   APPearance CLEAR CLEAR   Specific Gravity, Urine 1.020 1.005 - 1.030   pH 6.5 5.0 - 8.0   Glucose, UA NEGATIVE NEGATIVE mg/dL  Hgb urine dipstick MODERATE (A) NEGATIVE   Bilirubin Urine NEGATIVE NEGATIVE   Ketones, ur NEGATIVE NEGATIVE mg/dL   Protein, ur NEGATIVE NEGATIVE mg/dL   Urobilinogen, UA 1.0 0.0 - 1.0 mg/dL   Nitrite NEGATIVE NEGATIVE   Leukocytes, UA NEGATIVE NEGATIVE  Urine microscopic-add on     Status: Abnormal   Collection Time: 03/09/14  3:45 PM  Result Value Ref Range   Squamous Epithelial / LPF FEW (A) RARE   WBC, UA 0-2 <3 WBC/hpf   RBC / HPF 3-6 <3 RBC/hpf   Bacteria, UA FEW (A) RARE   Urine-Other MUCOUS PRESENT   Pregnancy, urine POC     Status: Abnormal   Collection Time: 03/09/14  4:03 PM  Result Value Ref Range   Preg Test, Ur POSITIVE (A) NEGATIVE  Wet prep, genital      Status: Abnormal   Collection Time: 03/09/14  4:36 PM  Result Value Ref Range   Yeast Wet Prep HPF POC NONE SEEN NONE SEEN   Trich, Wet Prep NONE SEEN NONE SEEN   Clue Cells Wet Prep HPF POC NONE SEEN NONE SEEN   WBC, Wet Prep HPF POC FEW (A) NONE SEEN  CBC     Status: Abnormal   Collection Time: 03/09/14  4:42 PM  Result Value Ref Range   WBC 7.7 4.0 - 10.5 K/uL   RBC 4.04 3.87 - 5.11 MIL/uL   Hemoglobin 11.2 (L) 12.0 - 15.0 g/dL   HCT 21.3 (L) 08.6 - 57.8 %   MCV 83.7 78.0 - 100.0 fL   MCH 27.7 26.0 - 34.0 pg   MCHC 33.1 30.0 - 36.0 g/dL   RDW 46.9 62.9 - 52.8 %   Platelets 206 150 - 400 K/uL  hCG, quantitative, pregnancy     Status: Abnormal   Collection Time: 03/09/14  4:42 PM  Result Value Ref Range   hCG, Beta Chain, Quant, S 22534 (H) <5 mIU/mL   US Ob Comp Less 14 Wks  03/09/2014   CLINICAL DATA:  Vaginal bleeding, positive pregnancy test. Unsure dates.  EXAM: OBSTETRIC <14 WK [place] AND TRANSVAGINAL OB US  TECHNIQUE: Both transabdominal and transvaginal ultrasound examinations were performed for complete evaluation of the gestation as well as the maternal uterus, adnexal regions, and pelvic cul-de-sac. Transvaginal technique was performed to assess early pregnancy.  COMPARISON:  None.  FINDINGS: Intrauterine gestational sac: Visualized/normal in shape.  Yolk sac:  Visualized  Embryo:  Visualized  Cardiac Activity: Visualized  Heart Rate: 99  bpm  CRL:  2  mm   5 w   6 d                  [place] EDC: 11/03/14  Maternal uterus/adnexae: Right ovarian corpus luteum. Left ovary not visualized. No left adnexal mass. No free fluid.  IMPRESSION: Intrauterine gestational sac, yolk sac, and fetal pole with cardiac activity identified, measuring 5 weeks 6 days by crown-rump length today. No acute abnormality.   Electronically Signed   By: Christiana Pellant M.D.   On: 03/09/2014 17:59   US Ob Transvaginal  03/09/2014   CLINICAL DATA:  Vaginal bleeding, positive pregnancy test. Unsure dates.  EXAM:  OBSTETRIC <14 WK [place] AND TRANSVAGINAL OB US  TECHNIQUE: Both transabdominal and transvaginal ultrasound examinations were performed for complete evaluation of the gestation as well as the maternal uterus, adnexal regions, and pelvic cul-de-sac. Transvaginal technique was performed to assess early pregnancy.  COMPARISON:  None.  FINDINGS: Intrauterine gestational sac: Visualized/normal  in shape.  Yolk sac:  Visualized  Embryo:  Visualized  Cardiac Activity: Visualized  Heart Rate: 99  bpm  CRL:  2  mm   5 w   6 d                  US EDC: 11/03/14  Maternal uterus/adnexae: Right ovarian corpus luteum. Left ovary not visualized. No left adnexal mass. No free fluid.  IMPRESSION: Intrauterine gestational sac, yolk sac, and fetal pole with cardiac activity identified, measuring 5 weeks 6 days by crown-rump length today. No acute abnormality.   Electronically Signed   By: Christiana PellantGretchen  Green M.D.   On: 03/09/2014 17:59    MAU Course  Procedures  MDM IUP confirmed along with cardiac activity.  No infection identified.    Assessment and Plan  A:  1. Threatened abortion   2. Vaginal bleeding in pregnancy, first trimester    P: Discharge to home [facility] qd Pregnancy verification letter provided Patient may return to MAU as needed or if her condition were to change or worsen   Bertram Denvereague Clark, Karen E 03/09/2014, 4:29 PM

## 2014-03-09 NOTE — MAU Note (Signed)
Pt presents to MAU with complaints of vaginal bleeding since last night. Reports + HPT last week.

## 2014-03-09 NOTE — Discharge Instructions (Signed)
Threatened Miscarriage °A threatened miscarriage occurs when you have vaginal bleeding during your first 20 weeks of pregnancy but the pregnancy has not ended. If you have vaginal bleeding during this time, your health care provider will do tests to make sure you are still pregnant. If the tests show you are still pregnant and the developing baby (fetus) inside your womb (uterus) is still growing, your condition is considered a threatened miscarriage. °A threatened miscarriage does not mean your pregnancy will end, but it does increase the risk of losing your pregnancy (complete miscarriage). °CAUSES  °The cause of a threatened miscarriage is usually not known. If you go on to have a complete miscarriage, the most common cause is an abnormal number of chromosomes in the developing baby. Chromosomes are the structures inside cells that hold all your genetic material. °Some causes of vaginal bleeding that do not result in miscarriage include: °· Having sex. °· Having an infection. °· Normal hormone changes of pregnancy. °· Bleeding that occurs when an egg implants in your uterus. °RISK FACTORS °Risk factors for bleeding in early pregnancy include: °· Obesity. °· Smoking. °· Drinking excessive amounts of alcohol or caffeine. °· Recreational drug use. °SIGNS AND SYMPTOMS °· Light vaginal bleeding. °· Mild abdominal pain or cramps. °DIAGNOSIS  °If you have bleeding with or without abdominal pain before 20 weeks of pregnancy, your health care provider will do tests to check whether you are still pregnant. One important test involves using sound waves and a computer (ultrasound) to create images of the inside of your uterus. Other tests include an internal exam of your vagina and uterus (pelvic exam) and measurement of your baby's heart rate.  °You may be diagnosed with a threatened miscarriage if: °· Ultrasound testing shows you are still pregnant. °· Your baby's heart rate is strong. °· A pelvic exam shows that the  opening between your uterus and your vagina (cervix) is closed. °· Your heart rate and blood pressure are stable. °· Blood tests confirm you are still pregnant. °TREATMENT  °No treatments have been shown to prevent a threatened miscarriage from going on to a complete miscarriage. However, the right home care is important.  °HOME CARE INSTRUCTIONS  °· Make sure you keep all your appointments for prenatal care. This is very important. °· Get plenty of rest. °· Do not have sex or use tampons if you have vaginal bleeding. °· Do not douche. °· Do not smoke or use recreational drugs. °· Do not drink alcohol. °· Avoid caffeine. °SEEK MEDICAL CARE IF: °· You have light vaginal bleeding or spotting while pregnant. °· You have abdominal pain or cramping. °· You have a fever. °SEEK IMMEDIATE MEDICAL CARE IF: °· You have heavy vaginal bleeding. °· You have blood clots coming from your vagina. °· You have severe low back pain or abdominal cramps. °· You have fever, chills, and severe abdominal pain. °MAKE SURE YOU: °· Understand these instructions. °· Will watch your condition. °· Will get help right away if you are not doing well or get worse. °Document Released: 01/17/2005 Document Revised: 01/22/2013 Document Reviewed: 11/13/2012 °ExitCare® Patient Information ©2015 [employer]. This information is not intended to replace advice given to you by your health care provider. Make sure you discuss any questions you have with your health care provider. ° °Pelvic Rest °Pelvic rest is sometimes recommended for women when:  °· The placenta is partially or completely covering the opening of the cervix (placenta previa). °· There is bleeding between the   uterine wall and the amniotic sac in the first trimester (subchorionic hemorrhage). °· The cervix begins to open without labor starting (incompetent cervix, cervical insufficiency). °· The labor is too early (preterm labor). °HOME CARE INSTRUCTIONS °· Do not have sexual intercourse,  stimulation, or an orgasm. °· Do not use tampons, douche, or put anything in the vagina. °· Do not lift anything over 10 pounds (4.5 kg). °· Avoid strenuous activity or straining your pelvic muscles. °SEEK MEDICAL CARE IF:  °· You have any vaginal bleeding during pregnancy. Treat this as a potential emergency. °· You have cramping pain felt low in the stomach (stronger than menstrual cramps). °· You notice vaginal discharge (watery, mucus, or bloody). °· You have a low, dull backache. °· There are regular contractions or uterine tightening. °SEEK IMMEDIATE MEDICAL CARE IF: °You have vaginal bleeding and have placenta previa.  °Document Released: 05/14/2010 Document Revised: 04/11/2011 Document Reviewed: 05/14/2010 °ExitCare® Patient Information ©2015 [employer]. This information is not intended to replace advice given to you by your health care provider. Make sure you discuss any questions you have with your health care provider. ° °

## 2014-03-10 LAB — GC/CHLAMYDIA PROBE AMP (~~LOC~~) NOT AT ARMC
Chlamydia: NEGATIVE
Neisseria Gonorrhea: NEGATIVE

## 2014-03-11 LAB — HIV ANTIBODY (ROUTINE TESTING W REFLEX): HIV Screen 4th Generation wRfx: NONREACTIVE

## 2014-04-15 LAB — OB RESULTS CONSOLE ANTIBODY SCREEN: ANTIBODY SCREEN: NEGATIVE

## 2014-04-15 LAB — OB RESULTS CONSOLE HEPATITIS B SURFACE ANTIGEN: Hepatitis B Surface Ag: NEGATIVE

## 2014-04-15 LAB — OB RESULTS CONSOLE HIV ANTIBODY (ROUTINE TESTING): HIV: NONREACTIVE

## 2014-04-15 LAB — OB RESULTS CONSOLE RPR: RPR: NONREACTIVE

## 2014-04-15 LAB — OB RESULTS CONSOLE ABO/RH: RH Type: POSITIVE

## 2014-05-13 ENCOUNTER — Other Ambulatory Visit (HOSPITAL_COMMUNITY): Payer: Self-pay | Admitting: Obstetrics

## 2014-05-13 DIAGNOSIS — IMO0002 Reserved for concepts with insufficient information to code with codable children: Secondary | ICD-10-CM

## 2014-05-13 DIAGNOSIS — O09522 Supervision of elderly multigravida, second trimester: Secondary | ICD-10-CM

## 2014-05-13 DIAGNOSIS — Z0489 Encounter for examination and observation for other specified reasons: Secondary | ICD-10-CM

## 2014-06-04 ENCOUNTER — Ambulatory Visit (HOSPITAL_COMMUNITY): Admission: RE | Admit: 2014-06-04 | Payer: Medicaid Other | Source: Ambulatory Visit

## 2014-06-04 ENCOUNTER — Encounter (HOSPITAL_COMMUNITY): Payer: Self-pay

## 2014-06-04 ENCOUNTER — Ambulatory Visit (HOSPITAL_COMMUNITY)
Admission: RE | Admit: 2014-06-04 | Discharge: 2014-06-04 | Disposition: A | Discharge status: Home / Self Care | Payer: Medicaid Other | Source: Ambulatory Visit | Attending: Obstetrics | Admitting: Obstetrics

## 2014-06-04 ENCOUNTER — Other Ambulatory Visit (HOSPITAL_COMMUNITY): Payer: Self-pay | Admitting: Obstetrics

## 2014-06-04 DIAGNOSIS — IMO0002 Reserved for concepts with insufficient information to code with codable children: Secondary | ICD-10-CM

## 2014-06-04 DIAGNOSIS — O09522 Supervision of elderly multigravida, second trimester: Secondary | ICD-10-CM | POA: Diagnosis not present

## 2014-06-04 DIAGNOSIS — Z36 Encounter for antenatal screening of mother: Secondary | ICD-10-CM | POA: Insufficient documentation

## 2014-06-04 DIAGNOSIS — Z3A18 18 weeks gestation of pregnancy: Secondary | ICD-10-CM | POA: Insufficient documentation

## 2014-06-04 DIAGNOSIS — O3421 Maternal care for scar from previous cesarean delivery: Secondary | ICD-10-CM | POA: Insufficient documentation

## 2014-06-04 DIAGNOSIS — O09529 Supervision of elderly multigravida, unspecified trimester: Secondary | ICD-10-CM | POA: Insufficient documentation

## 2014-06-04 DIAGNOSIS — O99212 Obesity complicating pregnancy, second trimester: Secondary | ICD-10-CM | POA: Insufficient documentation

## 2014-06-04 DIAGNOSIS — Z3689 Encounter for other specified antenatal screening: Secondary | ICD-10-CM | POA: Insufficient documentation

## 2014-06-04 DIAGNOSIS — Z0489 Encounter for examination and observation for other specified reasons: Secondary | ICD-10-CM

## 2014-07-02 ENCOUNTER — Inpatient Hospital Stay (HOSPITAL_COMMUNITY)
Admission: AD | Admit: 2014-07-02 | Discharge: 2014-07-02 | Disposition: A | Discharge status: Home / Self Care | Payer: Medicaid Other | Source: Ambulatory Visit | Attending: Obstetrics | Admitting: Obstetrics

## 2014-07-02 DIAGNOSIS — O9989 Other specified diseases and conditions complicating pregnancy, childbirth and the puerperium: Secondary | ICD-10-CM | POA: Diagnosis present

## 2014-07-02 DIAGNOSIS — Z3A22 22 weeks gestation of pregnancy: Secondary | ICD-10-CM | POA: Diagnosis not present

## 2014-07-02 DIAGNOSIS — O9A212 Injury, poisoning and certain other consequences of external causes complicating pregnancy, second trimester: Secondary | ICD-10-CM | POA: Diagnosis not present

## 2014-07-02 DIAGNOSIS — M545 Low back pain: Secondary | ICD-10-CM | POA: Insufficient documentation

## 2014-07-02 DIAGNOSIS — S3992XA Unspecified injury of lower back, initial encounter: Secondary | ICD-10-CM | POA: Diagnosis not present

## 2014-07-02 DIAGNOSIS — W010XXA Fall on same level from slipping, tripping and stumbling without subsequent striking against object, initial encounter: Secondary | ICD-10-CM | POA: Insufficient documentation

## 2014-07-02 DIAGNOSIS — W102XXA Fall (on)(from) incline, initial encounter: Secondary | ICD-10-CM

## 2014-07-02 LAB — URINALYSIS, ROUTINE W REFLEX MICROSCOPIC
BILIRUBIN URINE: NEGATIVE
Glucose, UA: NEGATIVE mg/dL
HGB URINE DIPSTICK: NEGATIVE
KETONES UR: 15 mg/dL — AB
LEUKOCYTES UA: NEGATIVE
NITRITE: NEGATIVE
PH: 5.5 (ref 5.0–8.0)
Protein, ur: NEGATIVE mg/dL
SPECIFIC GRAVITY, URINE: 1.015 (ref 1.005–1.030)
Urobilinogen, UA: 0.2 mg/dL (ref 0.0–1.0)

## 2014-07-02 MED ORDER — OXYCODONE-ACETAMINOPHEN 5-325 MG PO TABS: 1.0000 | ORAL_TABLET | ORAL | Status: DC | PRN

## 2014-07-02 NOTE — Discharge Instructions (Signed)

## 2014-07-02 NOTE — MAU Provider Note (Signed)
Chief Complaint: No chief complaint on file.   First Provider Initiated Contact with Patient 07/02/14 2047     SUBJECTIVE HPI: Rachel ParodyGloria K Appenzeller is a 39 y.o. [id number] at 7654w2d by LMP who presents to Maternity Admissions reporting that 3 hr PTA she slipped and fell landing on her buttocks. She then went to work but left due to increasing LBP and pain over upper buttock.  No abdominal pain. No vaginal bleeding. Good fetal movement.  Past Medical History  Diagnosis Date  . Anemia   . Seasonal allergies    OB History  Gravida Para Term Preterm AB SAB TAB Ectopic Multiple Living  7 3 3  0 3 2 1  0 0 3    # Outcome Date GA Lbr Len/2nd Weight Sex Delivery Anes PTL Lv  7 Current           6 Term 04/07/11 6691w0d  3.08 kg (6 lb 12.6 oz) F CS-LTranv Spinal  Y     Comments: no dysmorphic features; no void nor stool  5 SAB 04/2010          4 SAB 08/2009          3 Term 2007    F CS-Unspec   Y  2 TAB 2005          1 Term 2000    F CS-Unspec   Y     Past Surgical History  Procedure Laterality Date  . Cesarean section  [id number],2007  . Dilation and curettage of uterus  04/2010    missed ab  . Cesarean section  04/07/2011    Procedure: CESAREAN SECTION;  Surgeon: Philip AspenSidney Callahan, DO;  Location: [facility];  Service: Gynecology;  Laterality: N/A;  Repeat 04/14/11   History   Social History  . Marital Status: Married    Spouse Name: N/A  . Number of Children: 3  . Years of Education: N/A   Occupational History  . Nursing Aid    Social History Main Topics  . Smoking status: Never Smoker   . Smokeless tobacco: Never Used  . Alcohol Use: No  . Drug Use: No  . Sexual Activity: Yes     Comment: pregnant   Other Topics Concern  . Not on file   Social History Narrative   No current facility-administered medications on file prior to encounter.   Current Outpatient Prescriptions on File Prior to Encounter  Medication Sig Dispense Refill  . Prenatal Vit-Fe Fumarate-FA (PRENATAL MULTIVITAMIN) TABS  tablet Take 1 tablet by mouth daily at 12 noon.     No Known Allergies  ROS  OBJECTIVE Blood pressure 124/79, pulse 97, temperature 98 F (36.7 C), temperature source Oral, resp. rate 16, height 5\' 5"  (1.651 m), weight 124.739 kg (275 lb), last menstrual period 01/04/2014, SpO2 100 %, unknown if currently breastfeeding. GENERAL: Obese female in no acute distress. Gait nl.  HEART: normal rate RESP: normal effort GI: Abdomen soft, non-tender. S=D. DT FHR 142 MS: Nontender to minimally TTP over L-S region., no edema NEURO: Alert and oriented SKIN: No ecchymosis or skin disruption   LAB RESULTS No results found for this or any previous visit (from the past 24 hour(s)).   MAU COURSE D/W Dr. Gaynell FaceMarshall  ASSESSMENT 1. Fall (on)(from) incline, initial encounter   [phone] at 1554w2d   PLAN Discharge home with reassurance and AVS on fall precautions, back precautions     Medication List    TAKE these medications  oxyCODONE-acetaminophen 5-325 MG per tablet  Commonly known as:  PERCOCET/ROXICET  Take 1 tablet by mouth every 4 (four) hours as needed.     prenatal multivitamin Tabs tablet  Take 1 tablet by mouth daily at 12 noon.       Follow-up Information    Follow up with Kathreen Cosier, MD.   Specialty:  Obstetrics and Gynecology   Why:  Keep your scheduled prenatal appointment   Contact information:   [address] [place] [address] [phone]        Danae Orleans, CNM 07/02/2014  8:48 PM

## 2014-07-02 NOTE — MAU Note (Signed)
Pt reports she slipped down 2 steps on her butt at about 1730, denies bleeding. Denies abd pain . Reports soreness in lower back.

## 2014-08-05 LAB — OB RESULTS CONSOLE GC/CHLAMYDIA
Chlamydia: NEGATIVE
Gonorrhea: NEGATIVE

## 2014-08-05 LAB — OB RESULTS CONSOLE RUBELLA ANTIBODY, IGM: RUBELLA: IMMUNE

## 2014-08-06 ENCOUNTER — Other Ambulatory Visit (HOSPITAL_COMMUNITY): Payer: Self-pay | Admitting: Obstetrics

## 2014-08-06 DIAGNOSIS — O09523 Supervision of elderly multigravida, third trimester: Secondary | ICD-10-CM

## 2014-08-07 ENCOUNTER — Ambulatory Visit (HOSPITAL_COMMUNITY)
Admission: RE | Admit: 2014-08-07 | Discharge: 2014-08-07 | Disposition: A | Discharge status: Home / Self Care | Payer: Medicaid Other | Source: Ambulatory Visit | Attending: Obstetrics | Admitting: Obstetrics

## 2014-08-07 ENCOUNTER — Encounter (HOSPITAL_COMMUNITY): Payer: Self-pay

## 2014-08-07 DIAGNOSIS — Z3A27 27 weeks gestation of pregnancy: Secondary | ICD-10-CM | POA: Diagnosis not present

## 2014-08-07 DIAGNOSIS — O09522 Supervision of elderly multigravida, second trimester: Secondary | ICD-10-CM | POA: Diagnosis not present

## 2014-08-07 DIAGNOSIS — O3421 Maternal care for scar from previous cesarean delivery: Secondary | ICD-10-CM | POA: Diagnosis not present

## 2014-08-07 DIAGNOSIS — O09523 Supervision of elderly multigravida, third trimester: Secondary | ICD-10-CM

## 2014-10-07 ENCOUNTER — Other Ambulatory Visit: Payer: Self-pay | Admitting: Obstetrics

## 2014-10-24 NOTE — H&P (Signed)
NAME:  Rachel Wu, Rachel Wu NO.:  [id number]  MEDICAL RECORD NO.:  [id number]  LOCATION:  PERIO                         FACILITY:  [facility]  PHYSICIAN:  Kathreen Cosier, M.D.DATE OF BIRTH:  [birth year]  DATE OF ADMISSION:  10/29/2014 DATE OF DISCHARGE:                             HISTORY & PHYSICAL   DATE OF OPERATION:  October 29, 2014.  HISTORY OF PRESENT ILLNESS:  The patient is a 39 year old, gravida 6, para 3-0-2-3, [facility] October 3.  The patient has had 3 previous C-sections and is in for repeat C-section at 39 weeks.  Her GBS is negative.  She is A positive and her prenatal course has been uncomplicated.  PAST MEDICAL HISTORY:  Negative.  PAST SURGICAL HISTORY:  C-section x3.  SOCIAL HISTORY:  Negative.  FAMILY HISTORY:  Negative.  SYSTEM REVIEW:  Negative.  PHYSICAL EXAMINATION:  GENERAL:  Well-developed female, not in labor. HEENT:  Negative. LUNGS:  Clear to P and A. HEART:  Regular rhythm.  No murmurs, no gallops. BREASTS:  Negative. ABDOMEN:  Term. PELVIC:  Deferred. EXTREMITIES:  Negative.          ______________________________ Kathreen Cosier, M.D.     BAM/MEDQ  D:  10/23/2014  T:  10/23/2014  Job:  [id number]

## 2014-10-27 ENCOUNTER — Encounter (HOSPITAL_COMMUNITY): Payer: Self-pay

## 2014-10-27 ENCOUNTER — Encounter (HOSPITAL_COMMUNITY)
Admission: RE | Admit: 2014-10-27 | Discharge: 2014-10-27 | Disposition: A | Discharge status: Home / Self Care | Payer: Medicaid Other | Source: Ambulatory Visit | Attending: Obstetrics | Admitting: Obstetrics

## 2014-10-27 DIAGNOSIS — Z01818 Encounter for other preprocedural examination: Secondary | ICD-10-CM | POA: Diagnosis not present

## 2014-10-27 HISTORY — DX: Gastro-esophageal reflux disease without esophagitis: K21.9

## 2014-10-27 LAB — CBC
HEMATOCRIT: 36.3 % (ref 36.0–46.0)
Hemoglobin: 12.4 g/dL (ref 12.0–15.0)
MCH: 30 pg (ref 26.0–34.0)
MCHC: 34.2 g/dL (ref 30.0–36.0)
MCV: 87.9 fL (ref 78.0–100.0)
Platelets: 146 10*3/uL — ABNORMAL LOW (ref 150–400)
RBC: 4.13 MIL/uL (ref 3.87–5.11)
RDW: 14.5 % (ref 11.5–15.5)
WBC: 8 10*3/uL (ref 4.0–10.5)

## 2014-10-27 NOTE — Patient Instructions (Signed)
Your procedure is scheduled on:   October 29, 2014  Enter through the Main Entrance of [facility] at: 6:00 am   Pick up the phone at the desk and dial [phone].  Call this number if you have problems the morning of surgery: [phone].  Remember: Do NOT eat food:  After midnight on Tuesday  Do NOT drink clear liquids after:  Midnight on Tuesday  Take these medicines the morning of surgery with a SIP OF WATER:  None   Do NOT wear jewelry (body piercing), metal hair clips/bobby pins, or nail polish. Do NOT wear lotions, powders, or perfumes.  You may wear deoderant. Do NOT shave for 48 hours prior to surgery. Do NOT bring valuables to the hospital. Leave suitcase in car.  After surgery it may be brought to your room.  For patients admitted to the hospital, checkout time is 11:00 AM the day of discharge.

## 2014-10-28 LAB — RPR: RPR Ser Ql: NONREACTIVE

## 2014-10-28 MED ORDER — DEXTROSE 5 % IV SOLN
3.0000 g | INTRAVENOUS | Status: AC
  Administered 2014-10-29: 3 g via INTRAVENOUS
  Filled 2014-10-28: qty 3000

## 2014-10-29 ENCOUNTER — Encounter (HOSPITAL_COMMUNITY)
Admission: RE | Disposition: A | Discharge status: Home / Self Care | Payer: Self-pay | Source: Ambulatory Visit | Attending: Obstetrics

## 2014-10-29 ENCOUNTER — Inpatient Hospital Stay (HOSPITAL_COMMUNITY)
Admission: RE | Admit: 2014-10-29 | Discharge: 2014-11-01 | DRG: 766 | Disposition: A | Discharge status: Home / Self Care | Payer: Medicaid Other | Source: Ambulatory Visit | Attending: Obstetrics | Admitting: Obstetrics

## 2014-10-29 ENCOUNTER — Inpatient Hospital Stay (HOSPITAL_COMMUNITY): Payer: Medicaid Other | Admitting: Anesthesiology

## 2014-10-29 ENCOUNTER — Encounter (HOSPITAL_COMMUNITY): Payer: Self-pay | Admitting: *Deleted

## 2014-10-29 DIAGNOSIS — O34211 Maternal care for low transverse scar from previous cesarean delivery: Secondary | ICD-10-CM | POA: Diagnosis present

## 2014-10-29 DIAGNOSIS — K219 Gastro-esophageal reflux disease without esophagitis: Secondary | ICD-10-CM | POA: Diagnosis present

## 2014-10-29 DIAGNOSIS — Z98891 History of uterine scar from previous surgery: Secondary | ICD-10-CM

## 2014-10-29 DIAGNOSIS — Z3A39 39 weeks gestation of pregnancy: Secondary | ICD-10-CM

## 2014-10-29 LAB — PREPARE RBC (CROSSMATCH)

## 2014-10-29 SURGERY — Surgical Case
Anesthesia: Spinal

## 2014-10-29 MED ORDER — NALBUPHINE HCL 10 MG/ML IJ SOLN
5.0000 mg | INTRAMUSCULAR | Status: DC | PRN
  Filled 2014-10-29: qty 0.5

## 2014-10-29 MED ORDER — MEPERIDINE HCL 25 MG/ML IJ SOLN: 6.2500 mg | INTRAMUSCULAR | Status: DC | PRN

## 2014-10-29 MED ORDER — HYDROMORPHONE HCL 1 MG/ML IJ SOLN
INTRAMUSCULAR | Status: AC
  Administered 2014-10-29: 0.5 mg via INTRAVENOUS
  Filled 2014-10-29: qty 1

## 2014-10-29 MED ORDER — KETOROLAC TROMETHAMINE 30 MG/ML IJ SOLN
30.0000 mg | Freq: Four times a day (QID) | INTRAMUSCULAR | Status: DC | PRN
Start: 2014-10-29 — End: 2014-10-29

## 2014-10-29 MED ORDER — OXYTOCIN 40 UNITS IN LACTATED RINGERS INFUSION - SIMPLE MED: 62.5000 mL/h | INTRAVENOUS | Status: AC

## 2014-10-29 MED ORDER — SCOPOLAMINE 1 MG/3DAYS TD PT72
1.0000 | MEDICATED_PATCH | Freq: Once | TRANSDERMAL | Status: DC
  Administered 2014-10-29: 1.5 mg via TRANSDERMAL

## 2014-10-29 MED ORDER — PHENYLEPHRINE 8 MG IN D5W 100 ML (0.08MG/ML) PREMIX OPTIME
INJECTION | INTRAVENOUS | Status: AC
  Filled 2014-10-29: qty 100

## 2014-10-29 MED ORDER — PRENATAL MULTIVITAMIN CH
1.0000 | ORAL_TABLET | Freq: Every day | ORAL | Status: DC
  Administered 2014-10-30: 1 via ORAL
  Filled 2014-10-29 (×2): qty 1

## 2014-10-29 MED ORDER — DIPHENHYDRAMINE HCL 25 MG PO CAPS: 25.0000 mg | ORAL_CAPSULE | Freq: Four times a day (QID) | ORAL | Status: DC | PRN

## 2014-10-29 MED ORDER — MEPERIDINE HCL 25 MG/ML IJ SOLN
INTRAMUSCULAR | Status: AC
  Filled 2014-10-29: qty 1

## 2014-10-29 MED ORDER — NALBUPHINE HCL 10 MG/ML IJ SOLN
5.0000 mg | Freq: Once | INTRAMUSCULAR | Status: DC | PRN
  Filled 2014-10-29: qty 0.5

## 2014-10-29 MED ORDER — KETOROLAC TROMETHAMINE 30 MG/ML IJ SOLN
INTRAMUSCULAR | Status: AC
  Filled 2014-10-29: qty 1

## 2014-10-29 MED ORDER — OXYTOCIN 10 UNIT/ML IJ SOLN
INTRAMUSCULAR | Status: AC
  Filled 2014-10-29: qty 4

## 2014-10-29 MED ORDER — SIMETHICONE 80 MG PO CHEW
80.0000 mg | CHEWABLE_TABLET | Freq: Three times a day (TID) | ORAL | Status: DC
  Administered 2014-10-29 – 2014-11-01 (×7): 80 mg via ORAL
  Filled 2014-10-29 (×7): qty 1

## 2014-10-29 MED ORDER — SIMETHICONE 80 MG PO CHEW
80.0000 mg | CHEWABLE_TABLET | ORAL | Status: DC
  Administered 2014-10-30 – 2014-11-01 (×2): 80 mg via ORAL
  Filled 2014-10-29: qty 1

## 2014-10-29 MED ORDER — DIBUCAINE 1 % RE OINT: 1.0000 "application " | TOPICAL_OINTMENT | RECTAL | Status: DC | PRN

## 2014-10-29 MED ORDER — INFLUENZA VAC SPLIT QUAD 0.5 ML IM SUSY
0.5000 mL | PREFILLED_SYRINGE | INTRAMUSCULAR | Status: AC
  Administered 2014-11-01: 0.5 mL via INTRAMUSCULAR
  Filled 2014-10-29: qty 0.5

## 2014-10-29 MED ORDER — SCOPOLAMINE 1 MG/3DAYS TD PT72
MEDICATED_PATCH | TRANSDERMAL | Status: AC
  Administered 2014-10-29: 1.5 mg via TRANSDERMAL
  Filled 2014-10-29: qty 1

## 2014-10-29 MED ORDER — OXYCODONE-ACETAMINOPHEN 5-325 MG PO TABS
1.0000 | ORAL_TABLET | ORAL | Status: DC | PRN
  Administered 2014-10-31: 1 via ORAL

## 2014-10-29 MED ORDER — LACTATED RINGERS IV SOLN
Freq: Once | INTRAVENOUS | Status: AC
Start: 2014-10-29 — End: 2014-10-29
  Administered 2014-10-29: 07:00:00 via INTRAVENOUS

## 2014-10-29 MED ORDER — KETOROLAC TROMETHAMINE 30 MG/ML IJ SOLN
30.0000 mg | Freq: Four times a day (QID) | INTRAMUSCULAR | Status: DC | PRN
  Administered 2014-10-29: 30 mg via INTRAMUSCULAR

## 2014-10-29 MED ORDER — LACTATED RINGERS IV SOLN: INTRAVENOUS | Status: DC

## 2014-10-29 MED ORDER — MORPHINE SULFATE (PF) 0.5 MG/ML IJ SOLN
INTRAMUSCULAR | Status: AC
  Filled 2014-10-29: qty 100

## 2014-10-29 MED ORDER — SENNOSIDES-DOCUSATE SODIUM 8.6-50 MG PO TABS
2.0000 | ORAL_TABLET | ORAL | Status: DC
Start: 2014-10-30 — End: 2014-11-01
  Administered 2014-10-29 – 2014-11-01 (×3): 2 via ORAL
  Filled 2014-10-29 (×3): qty 2

## 2014-10-29 MED ORDER — FENTANYL CITRATE (PF) 100 MCG/2ML IJ SOLN
INTRAMUSCULAR | Status: DC | PRN
  Administered 2014-10-29: 15 ug via INTRATHECAL

## 2014-10-29 MED ORDER — FENTANYL CITRATE (PF) 100 MCG/2ML IJ SOLN
INTRAMUSCULAR | Status: AC
  Filled 2014-10-29: qty 4

## 2014-10-29 MED ORDER — LACTATED RINGERS IV SOLN
INTRAVENOUS | Status: DC | PRN
  Administered 2014-10-29 (×3): via INTRAVENOUS

## 2014-10-29 MED ORDER — SIMETHICONE 80 MG PO CHEW
80.0000 mg | CHEWABLE_TABLET | ORAL | Status: DC | PRN
  Administered 2014-10-29: 80 mg via ORAL

## 2014-10-29 MED ORDER — SODIUM CHLORIDE 0.9 % IJ SOLN: 3.0000 mL | INTRAMUSCULAR | Status: DC | PRN

## 2014-10-29 MED ORDER — HYDROMORPHONE HCL 1 MG/ML IJ SOLN: 1.0000 mg | Freq: Once | INTRAMUSCULAR | Status: DC

## 2014-10-29 MED ORDER — WITCH HAZEL-GLYCERIN EX PADS: 1.0000 "application " | MEDICATED_PAD | CUTANEOUS | Status: DC | PRN

## 2014-10-29 MED ORDER — NALOXONE HCL 1 MG/ML IJ SOLN
1.0000 ug/kg/h | INTRAVENOUS | Status: DC | PRN
  Filled 2014-10-29: qty 2

## 2014-10-29 MED ORDER — OXYCODONE-ACETAMINOPHEN 5-325 MG PO TABS
2.0000 | ORAL_TABLET | ORAL | Status: DC | PRN
  Administered 2014-10-29 – 2014-11-01 (×8): 2 via ORAL
  Filled 2014-10-29 (×10): qty 2

## 2014-10-29 MED ORDER — IBUPROFEN 600 MG PO TABS
600.0000 mg | ORAL_TABLET | Freq: Four times a day (QID) | ORAL | Status: DC
  Administered 2014-10-29 – 2014-11-01 (×11): 600 mg via ORAL
  Filled 2014-10-29 (×12): qty 1

## 2014-10-29 MED ORDER — OXYTOCIN 10 UNIT/ML IJ SOLN
40.0000 [IU] | INTRAVENOUS | Status: DC | PRN
  Administered 2014-10-29: 40 [IU] via INTRAVENOUS

## 2014-10-29 MED ORDER — LANOLIN HYDROUS EX OINT: 1.0000 "application " | TOPICAL_OINTMENT | CUTANEOUS | Status: DC | PRN

## 2014-10-29 MED ORDER — MENTHOL 3 MG MT LOZG: 1.0000 | LOZENGE | OROMUCOSAL | Status: DC | PRN

## 2014-10-29 MED ORDER — SODIUM CHLORIDE 0.9 % IJ SOLN
INTRAMUSCULAR | Status: AC
  Filled 2014-10-29: qty 10

## 2014-10-29 MED ORDER — TETANUS-DIPHTH-ACELL PERTUSSIS 5-2.5-18.5 LF-MCG/0.5 IM SUSP
0.5000 mL | Freq: Once | INTRAMUSCULAR | Status: AC
Start: 2014-10-30 — End: 2014-11-01
  Administered 2014-11-01: 0.5 mL via INTRAMUSCULAR
  Filled 2014-10-29: qty 0.5

## 2014-10-29 MED ORDER — BUPIVACAINE IN DEXTROSE 0.75-8.25 % IT SOLN
INTRATHECAL | Status: DC | PRN
  Administered 2014-10-29: 1.6 mL via INTRATHECAL

## 2014-10-29 MED ORDER — ONDANSETRON HCL 4 MG/2ML IJ SOLN
INTRAMUSCULAR | Status: AC
  Filled 2014-10-29: qty 2

## 2014-10-29 MED ORDER — ACETAMINOPHEN 325 MG PO TABS: 650.0000 mg | ORAL_TABLET | ORAL | Status: DC | PRN

## 2014-10-29 MED ORDER — NALOXONE HCL 0.4 MG/ML IJ SOLN: 0.4000 mg | INTRAMUSCULAR | Status: DC | PRN

## 2014-10-29 MED ORDER — PHENYLEPHRINE 8 MG IN D5W 100 ML (0.08MG/ML) PREMIX OPTIME
INJECTION | INTRAVENOUS | Status: DC | PRN
  Administered 2014-10-29: 60 ug/min via INTRAVENOUS

## 2014-10-29 MED ORDER — HYDROMORPHONE HCL 1 MG/ML IJ SOLN
0.2500 mg | INTRAMUSCULAR | Status: DC | PRN
  Administered 2014-10-29 (×2): 0.5 mg via INTRAVENOUS

## 2014-10-29 MED ORDER — MEPERIDINE HCL 25 MG/ML IJ SOLN
INTRAMUSCULAR | Status: DC | PRN
  Administered 2014-10-29: 25 mg via INTRAVENOUS

## 2014-10-29 MED ORDER — ONDANSETRON HCL 4 MG/2ML IJ SOLN: 4.0000 mg | Freq: Three times a day (TID) | INTRAMUSCULAR | Status: DC | PRN

## 2014-10-29 MED ORDER — ZOLPIDEM TARTRATE 5 MG PO TABS: 5.0000 mg | ORAL_TABLET | Freq: Every evening | ORAL | Status: DC | PRN

## 2014-10-29 MED ORDER — ONDANSETRON HCL 4 MG/2ML IJ SOLN
INTRAMUSCULAR | Status: DC | PRN
Start: 2014-10-29 — End: 2014-10-29
  Administered 2014-10-29: 4 mg via INTRAVENOUS

## 2014-10-29 SURGICAL SUPPLY — 38 items
CLAMP CORD UMBIL (MISCELLANEOUS) IMPLANT
CLOTH BEACON ORANGE TIMEOUT ST (SAFETY) ×3 IMPLANT
DRAPE SHEET LG 3/4 BI-LAMINATE (DRAPES) IMPLANT
DRSG OPSITE POSTOP 4X10 (GAUZE/BANDAGES/DRESSINGS) ×3 IMPLANT
DURAPREP 26ML APPLICATOR (WOUND CARE) ×3 IMPLANT
ELECT REM PT RETURN 9FT ADLT (ELECTROSURGICAL) ×3
ELECTRODE REM PT RTRN 9FT ADLT (ELECTROSURGICAL) ×1 IMPLANT
EXTRACTOR VACUUM M CUP 4 TUBE (SUCTIONS) IMPLANT
EXTRACTOR VACUUM M CUP 4' TUBE (SUCTIONS)
GLOVE BIO SURGEON STRL SZ8.5 (GLOVE) ×3 IMPLANT
GOWN STRL REUS W/TWL 2XL LVL3 (GOWN DISPOSABLE) ×3 IMPLANT
GOWN STRL REUS W/TWL LRG LVL3 (GOWN DISPOSABLE) ×3 IMPLANT
KIT ABG SYR 3ML LUER SLIP (SYRINGE) IMPLANT
NDL HYPO 25X5/8 SAFETYGLIDE (NEEDLE) IMPLANT
NEEDLE HYPO 25X5/8 SAFETYGLIDE (NEEDLE) IMPLANT
NS IRRIG 1000ML POUR BTL (IV SOLUTION) ×3 IMPLANT
PACK C SECTION WH (CUSTOM PROCEDURE TRAY) ×3 IMPLANT
PAD ABD 8X7 1/2 STERILE (GAUZE/BANDAGES/DRESSINGS) ×2 IMPLANT
PAD OB MATERNITY 4.3X12.25 (PERSONAL CARE ITEMS) ×3 IMPLANT
PENCIL SMOKE EVAC W/HOLSTER (ELECTROSURGICAL) ×3 IMPLANT
RETRACTOR TRAXI PANNICULUS (MISCELLANEOUS) IMPLANT
RTRCTR C-SECT PINK 25CM LRG (MISCELLANEOUS) ×2 IMPLANT
SPONGE GAUZE 4X4 12PLY STER LF (GAUZE/BANDAGES/DRESSINGS) ×2 IMPLANT
SUT CHROMIC 0 CT 802H (SUTURE) ×3 IMPLANT
SUT CHROMIC 0 MO4 CR (SUTURE) IMPLANT
SUT CHROMIC 1 CTX 36 (SUTURE) ×10 IMPLANT
SUT CHROMIC 2 0 SH (SUTURE) ×3 IMPLANT
SUT GUT PLAIN 0 CT-3 TAN 27 (SUTURE) IMPLANT
SUT MON AB 4-0 PS1 27 (SUTURE) ×3 IMPLANT
SUT PDS AB 0 CTX 36 PDP370T (SUTURE) IMPLANT
SUT VIC AB 0 CT1 18XCR BRD8 (SUTURE) IMPLANT
SUT VIC AB 0 CT1 8-18 (SUTURE)
SUT VIC AB 0 CTX 36 (SUTURE) ×6
SUT VIC AB 0 CTX36XBRD ANBCTRL (SUTURE) ×2 IMPLANT
TAPE CLOTH SURG 4X10 WHT LF (GAUZE/BANDAGES/DRESSINGS) ×2 IMPLANT
TOWEL OR 17X24 6PK STRL BLUE (TOWEL DISPOSABLE) ×3 IMPLANT
TRAXI PANNICULUS RETRACTOR (MISCELLANEOUS) ×2
TRAY FOLEY CATH SILVER 14FR (SET/KITS/TRAYS/PACK) ×3 IMPLANT

## 2014-10-29 NOTE — Anesthesia Preprocedure Evaluation (Addendum)
Anesthesia Evaluation  Patient identified by MRN, date of birth, ID band Patient awake    Reviewed: Allergy & Precautions, NPO status , Patient's Chart, lab work & pertinent test results  Airway Mallampati: II  TM Distance: >3 FB Neck ROM: Full    Dental  (+) Teeth Intact   Pulmonary neg pulmonary ROS,    breath sounds clear to auscultation       Cardiovascular negative cardio ROS   Rhythm:Regular Rate:Normal     Neuro/Psych negative neurological ROS  negative psych ROS   GI/Hepatic Neg liver ROS, GERD  ,  Endo/Other  negative endocrine ROS  Renal/GU negative Renal ROS  negative genitourinary   Musculoskeletal negative musculoskeletal ROS (+)   Abdominal   Peds negative pediatric ROS (+)  Hematology negative hematology ROS (+)   Anesthesia Other Findings   Reproductive/Obstetrics negative OB ROS                            Lab Results  Component Value Date   WBC 8.0 10/27/2014   HGB 12.4 10/27/2014   HCT 36.3 10/27/2014   MCV 87.9 10/27/2014   PLT 146* 10/27/2014    Anesthesia Physical Anesthesia Plan  ASA: III  Anesthesia Plan: Spinal   Post-op Pain Management:    Induction: Intravenous  Airway Management Planned: Natural Airway and Simple Face Mask  Additional Equipment:   Intra-op Plan:   Post-operative Plan:   Informed Consent: I have reviewed the patients History and Physical, chart, labs and discussed the procedure including the risks, benefits and alternatives for the proposed anesthesia with the patient or authorized representative who has indicated his/her understanding and acceptance.     Plan Discussed with: CRNA  Anesthesia Plan Comments:        Anesthesia Quick Evaluation

## 2014-10-29 NOTE — Anesthesia Postprocedure Evaluation (Signed)
  Anesthesia Post-op Note  Patient: Rachel Wu  Procedure(s) Performed: Procedure(s): REPEAT CESAREAN SECTION (N/A)  Patient Location: PACU  Anesthesia Type:Spinal  Level of Consciousness: awake, alert  and oriented  Airway and Oxygen Therapy: Patient Spontanous Breathing  Post-op Pain: mild  Post-op Assessment: Post-op Vital signs reviewed and Patient's Cardiovascular Status Stable              Post-op Vital Signs: Reviewed and stable  Last Vitals:  Filed Vitals:   10/29/14 0930  BP: 112/77  Pulse: 80  Temp:   Resp: 20    Complications: No apparent anesthesia complications

## 2014-10-29 NOTE — Anesthesia Procedure Notes (Signed)
Spinal Patient location during procedure: OR Start time: 10/29/2014 7:25 AM End time: 10/29/2014 7:45 AM Staffing Anesthesiologist: Suella Broad D Other anesthesia staff: Lauretta Grill Performed by: anesthesiologist and other anesthesia staff  Preanesthetic Checklist Completed: patient identified, site marked, surgical consent, pre-op evaluation, timeout performed, IV checked, risks and benefits discussed and monitors and equipment checked Spinal Block Patient position: sitting Prep: Betadine Patient monitoring: heart rate, continuous pulse ox, blood pressure and cardiac monitor Approach: midline Location: L4-5 Injection technique: single-shot Needle Needle type: Whitacre and Introducer  Needle gauge: 24 G Needle length: 9 cm Additional Notes Negative paresthesia. Negative blood return. Positive free-flowing CSF. Expiration date of kit checked and confirmed. Patient tolerated procedure well, without complications.   After 3 attempts for spinal, converted to CSE.

## 2014-10-29 NOTE — Op Note (Signed)
Preop diagnosis previous cesarean section 3 at 39 weeks for repeat Postop diagnosis repeat low transverse cesarean section Surgeon Dr. Francoise Ceo First assistant Dr. Coral Ceo Anesthesia spinal Procedure patient placed on the operating table in the supine position after the spinal administered abdomen prepped and draped bladder emptied with a Foley catheter a transverse suprapubic incision made through the old scar carried down to the rectus fascia fascia cleaned and incised length of the incision recti muscles retracted laterally peritoneum incised longitudinally transverse incision made on the visceroperitoneum above the bladder and the bladder mobilized inferiorly transverse low uterine incision made and she had a female Apgar 8 and 9 weighing 8 lbs. 4 oz. the placenta was anterior removed manually and sent to labor and delivery uterine cavity clean with dry laps uterine incision closed in 3 layers with continuous suture of #1 chromic hemostasis was satisfactory lap and sponge counts correct abdomen closed in layers peritoneum continuous  0 chromic fascia continuous  o dexon  and the skin closed with skin clips blood loss was 900 cc patient tolerated the procedure well

## 2014-10-29 NOTE — OR Nursing (Signed)
Pt ready to be discharged, no rooms avail. Waiting on room.

## 2014-10-29 NOTE — Transfer of Care (Signed)
Immediate Anesthesia Transfer of Care Note  Patient: Rachel Wu  Procedure(s) Performed: Procedure(s): REPEAT CESAREAN SECTION (N/A)  Patient Location: PACU  Anesthesia Type:Spinal  Level of Consciousness: awake  Airway & Oxygen Therapy: Patient Spontanous Breathing  Post-op Assessment: Report given to RN  Post vital signs: Reviewed and stable  Last Vitals:  Filed Vitals:   10/29/14 0618  BP: 121/76  Pulse: 99  Temp: 36.7 C  Resp: 18    Complications: No apparent anesthesia complications

## 2014-10-29 NOTE — H&P (Signed)
  There has been no change in her history and physical since the original dictation 

## 2014-10-30 ENCOUNTER — Encounter (HOSPITAL_COMMUNITY): Payer: Self-pay | Admitting: Obstetrics

## 2014-10-30 LAB — CBC
HCT: 31.2 % — ABNORMAL LOW (ref 36.0–46.0)
Hemoglobin: 10.5 g/dL — ABNORMAL LOW (ref 12.0–15.0)
MCH: 29.5 pg (ref 26.0–34.0)
MCHC: 33.7 g/dL (ref 30.0–36.0)
MCV: 87.6 fL (ref 78.0–100.0)
PLATELETS: 135 10*3/uL — AB (ref 150–400)
RBC: 3.56 MIL/uL — ABNORMAL LOW (ref 3.87–5.11)
RDW: 14.4 % (ref 11.5–15.5)
WBC: 9.4 10*3/uL (ref 4.0–10.5)

## 2014-10-30 LAB — BIRTH TISSUE RECOVERY COLLECTION (PLACENTA DONATION)

## 2014-10-30 NOTE — Progress Notes (Signed)
Called to patient's room.  Patient had walked to the bathroom and was feeling dizzy.  Patient states this time is worse than her other surgeries.  She feels so much pressure from gas.  Encouraged patient to walk in the hallway to get gas moving because it can build up and go up her chest to her shoulder blades.  Patient states she understands but just wants o go sit in the chair now.  Helped patient to the chair.

## 2014-10-30 NOTE — Lactation Note (Signed)
This note was copied from the chart of Rachel Sueko Dimichele. Lactation Consultation Note Mom sleepy, states BF going ok. Asked if she had BF any of her other children and stated yes but she can't remember how long, it wasn't very long. Didn't love it. Mom has large pendulum breast, large inverted nipples that evert well w/stimulation of touch. Taught hand expression, mom tender, not seeing colostrum d/t tenderness. Encouraged mom to wear supportive bra in am.  Asked mom if baby was latching on her nipple well, stated sometimes when he wants to, sometimes he doesn't want to. Mom is breast bottle. Explained supplementing w/formula cuts back on milk supply, discussed suplpy and demand.  Patient Name: Rachel Wu [id number] Date: 10/30/2014 Reason for consult: Initial assessment   Maternal Data Has patient been taught Hand Expression?: Yes Does the patient have breastfeeding experience prior to this delivery?: Yes  Feeding    LATCH Score/Interventions       Type of Nipple: Everted at rest and after stimulation  Comfort (Breast/Nipple): Soft / non-tender     Intervention(s): Breastfeeding basics reviewed;Support Pillows;Position options;Skin to skin     Lactation Tools Discussed/Used     Consult Status Consult Status: Follow-up Date: 10/31/14 Follow-up type: In-patient    CARVER, Diamond Nickel 10/30/2014, 2:57 AM

## 2014-10-30 NOTE — Progress Notes (Signed)
Patient ID: Rachel Wu, female   DOB: 12/07/1975, 39 y.o.   MRN: [id number] Postop day 1 Blood pressure 126/76 respiration 18 pulse 90 afebrile  output good Abdomen soft dressing dry lochia moderate doing well

## 2014-10-31 LAB — TYPE AND SCREEN
ABO/RH(D): A POS
Antibody Screen: NEGATIVE
UNIT DIVISION: 0
Unit division: 0

## 2014-10-31 MED ORDER — BISACODYL 10 MG RE SUPP
10.0000 mg | Freq: Once | RECTAL | Status: AC
  Administered 2014-10-31: 10 mg via RECTAL
  Filled 2014-10-31: qty 1

## 2014-10-31 MED ORDER — FLEET ENEMA 7-19 GM/118ML RE ENEM
1.0000 | ENEMA | Freq: Once | RECTAL | Status: AC
  Administered 2014-10-31: 1 via RECTAL

## 2014-10-31 NOTE — Progress Notes (Signed)
Patient ID: Rachel Wu, female   DOB: 07-11-1975, 39 y.o.   MRN: [id number] Postop day 2 Blood pressure 139 placenta 1 respiration 18 pulse 84 afebrile Abdomen soft good bowel sounds dressing dry Lochia moderate Legs negative doing well

## 2014-11-01 NOTE — Progress Notes (Signed)
Patient ID: Rachel Wu, female   DOB: 1975-02-28, 39 y.o.   MRN: [id number] Postop day 3 Vital signs normal blood pressure 110/58 respiration 18 pulse 66 afebrile Abdomen soft Dressing dry Legs negative home today

## 2014-11-01 NOTE — Lactation Note (Signed)
This note was copied from the chart of Rachel Amellia Panik. Lactation Consultation Note  Patient Name: Rachel Wu ZOXWR'U Date: 11/01/2014 Reason for consult: Follow-up assessment   With this mom and term baby, now 21 hours old. When I walked in the room, the baby was cing. I said to dad, who was holding the baby, that he looked hungry. He proceeded to pick up a bottle of formula, and began feeding the baby. Mom was in the bathroom. Dad states mom is feeding both breast and bottle/formula feeding.     Maternal Data    Feeding Length of feed: 10 min  LATCH Score/Interventions                      Lactation Tools Discussed/Used     Consult Status Consult Status: Complete Follow-up type: Call as needed    Rachel Wu 11/01/2014, 10:25 AM

## 2014-11-01 NOTE — Discharge Summary (Signed)
Obstetric Discharge Summary Reason for Admission: cesarean section Prenatal Procedures: none Intrapartum Procedures: cesarean: low cervical, transverse Postpartum Procedures: none Complications-Operative and Postpartum: none HEMOGLOBIN  Date Value Ref Range Status  10/30/2014 10.5* 12.0 - 15.0 g/dL Final   HCT  Date Value Ref Range Status  10/30/2014 31.2* 36.0 - 46.0 % Final    Physical Exam:  General: alert Lochia: appropriate Uterine Fundus: firm Incision: healing well DVT Evaluation: No evidence of DVT seen on physical exam.  Discharge Diagnoses: Term Pregnancy-delivered  Discharge Information: Date: 11/01/2014 Activity: pelvic rest Diet: routine Medications: Percocet Condition: stable Instructions: refer to practice specific booklet Discharge to: home Follow-up Information    Follow up with Kathreen Cosier, MD.   Specialty:  Obstetrics and Gynecology   Contact information:   [address] [place] [address] [phone]       Newborn Data: Live born female  Birth Weight: 8 lb 6.6 oz (3815 g) APGAR: 8, 9  Home with mother.  Palmer Fahrner A 11/01/2014, 6:59 AM

## 2014-11-01 NOTE — Discharge Instructions (Signed)
Discharge instructions   You can wash your hair  Shower  Eat what you want  Drink what you want  See me in 6 weeks  Your ankles are going to swell more in the next 2 weeks than when pregnant  No sex for 6 weeks   Matvey Llanas A, MD 11/01/2014

## 2015-02-01 DIAGNOSIS — O43219 Placenta accreta, unspecified trimester: Secondary | ICD-10-CM

## 2015-02-05 ENCOUNTER — Other Ambulatory Visit: Payer: Self-pay | Admitting: Sports Medicine

## 2015-02-05 DIAGNOSIS — M549 Dorsalgia, unspecified: Secondary | ICD-10-CM

## 2015-02-13 ENCOUNTER — Inpatient Hospital Stay: Admission: RE | Admit: 2015-02-13 | Payer: Medicaid Other | Source: Ambulatory Visit

## 2015-02-13 ENCOUNTER — Ambulatory Visit
Admission: RE | Admit: 2015-02-13 | Discharge: 2015-02-13 | Disposition: A | Discharge status: Home / Self Care | Payer: Medicaid Other | Source: Ambulatory Visit | Attending: Sports Medicine | Admitting: Sports Medicine

## 2015-02-13 DIAGNOSIS — M549 Dorsalgia, unspecified: Secondary | ICD-10-CM

## 2015-03-11 ENCOUNTER — Other Ambulatory Visit (HOSPITAL_COMMUNITY): Payer: Self-pay | Admitting: *Deleted

## 2015-03-11 ENCOUNTER — Inpatient Hospital Stay (HOSPITAL_COMMUNITY)
Admission: RE | Admit: 2015-03-11 | Discharge: 2015-03-11 | Disposition: A | Discharge status: Home / Self Care | Payer: Medicaid Other | Source: Ambulatory Visit

## 2015-03-11 NOTE — Pre-Procedure Instructions (Signed)
    Lovell Roe Glover  03/11/2015      [facility] [address] [address], [place] - [address] AT [facility] [address] & [facility] [address] [place] [address] Phone: [phone] Fax: [phone]    Your procedure is scheduled on Tuesday, March 17, 2015 at 8:00 AM.   Report to [facility] Entrance "A" Admitting Office at 6:00 AM.   Call this number if you have problems the morning of surgery: [phone]   Any questions prior to day of surgery, please call [phone] between 8 & 4 PM.   Remember:  Do not eat food or drink liquids after midnight Monday, 03/16/15.    Do not wear jewelry, make-up or nail polish.  Do not wear lotions, powders, or perfumes.  You may wear deodorant.  Do not shave 48 hours prior to surgery.    Do not bring valuables to the hospital.  [facility] is not responsible for any belongings or valuables.  Contacts, dentures or bridgework may not be worn into surgery.  Leave your suitcase in the car.  After surgery it may be brought to your room.  For patients admitted to the hospital, discharge time will be determined by your treatment team.  Patients discharged the day of surgery will not be allowed to drive home.   Please read over the following fact sheets that you were given. Pain Booklet and Coughing and Deep Breathing

## 2015-03-17 ENCOUNTER — Ambulatory Visit (HOSPITAL_COMMUNITY): Payer: Medicaid Other

## 2015-03-19 ENCOUNTER — Inpatient Hospital Stay (HOSPITAL_COMMUNITY)
Admission: RE | Admit: 2015-03-19 | Discharge: 2015-03-19 | Disposition: A | Discharge status: Home / Self Care | Payer: Medicaid Other | Source: Ambulatory Visit

## 2015-03-19 NOTE — Pre-Procedure Instructions (Signed)
    Courtnie Brenes Preuss  03/19/2015      [facility] [address] [address], [place] - [address] AT [facility] OF [address] & [facility] [address] [place] [address] Phone: [phone] Fax: [phone]    Your procedure is scheduled on Thursday, February 23rd, 2017.  Report to [facility] Admitting at 6:00 A.M.  Call this number if you have problems the morning of surgery:  [phone]   Remember:  Do not eat food or drink liquids after midnight.   Take these medicines the morning of surgery with A SIP OF WATER: None.    Do not wear jewelry, make-up or nail polish.  Do not wear lotions, powders, or perfumes.  You may wear deodorant.  Do not shave 48 hours prior to surgery.    Do not bring valuables to the hospital.  [facility] is not responsible for any belongings or valuables.  Contacts, dentures or bridgework may not be worn into surgery.  Leave your suitcase in the car.  After surgery it may be brought to your room.  For patients admitted to the hospital, discharge time will be determined by your treatment team.  Patients discharged the day of surgery will not be allowed to drive home.   Special instructions:  See attached.   Please read over the following fact sheets that you were given. Pain Booklet and Coughing and Deep Breathing

## 2015-03-26 ENCOUNTER — Encounter (HOSPITAL_COMMUNITY): Admission: RE | Payer: Self-pay | Source: Ambulatory Visit

## 2015-03-26 ENCOUNTER — Ambulatory Visit (HOSPITAL_COMMUNITY): Payer: Medicaid Other

## 2015-03-26 ENCOUNTER — Ambulatory Visit (HOSPITAL_COMMUNITY): Admission: RE | Admit: 2015-03-26 | Payer: Medicaid Other | Source: Ambulatory Visit

## 2015-03-26 SURGERY — RADIOLOGY WITH ANESTHESIA
Anesthesia: General | Site: Back

## 2015-07-20 ENCOUNTER — Inpatient Hospital Stay (HOSPITAL_COMMUNITY)
Admission: AD | Admit: 2015-07-20 | Discharge: 2015-07-20 | Disposition: A | Discharge status: Home / Self Care | Payer: Medicaid Other | Source: Ambulatory Visit | Attending: Obstetrics | Admitting: Obstetrics

## 2015-07-20 ENCOUNTER — Encounter (HOSPITAL_COMMUNITY): Payer: Self-pay

## 2015-07-20 DIAGNOSIS — Z8249 Family history of ischemic heart disease and other diseases of the circulatory system: Secondary | ICD-10-CM | POA: Diagnosis not present

## 2015-07-20 DIAGNOSIS — Z833 Family history of diabetes mellitus: Secondary | ICD-10-CM | POA: Insufficient documentation

## 2015-07-20 DIAGNOSIS — Z3A24 24 weeks gestation of pregnancy: Secondary | ICD-10-CM | POA: Insufficient documentation

## 2015-07-20 DIAGNOSIS — O99891 Other specified diseases and conditions complicating pregnancy: Secondary | ICD-10-CM

## 2015-07-20 DIAGNOSIS — O9989 Other specified diseases and conditions complicating pregnancy, childbirth and the puerperium: Secondary | ICD-10-CM

## 2015-07-20 DIAGNOSIS — R109 Unspecified abdominal pain: Secondary | ICD-10-CM | POA: Diagnosis present

## 2015-07-20 DIAGNOSIS — M549 Dorsalgia, unspecified: Secondary | ICD-10-CM | POA: Diagnosis not present

## 2015-07-20 DIAGNOSIS — R102 Pelvic and perineal pain unspecified side: Secondary | ICD-10-CM

## 2015-07-20 DIAGNOSIS — O26892 Other specified pregnancy related conditions, second trimester: Secondary | ICD-10-CM | POA: Insufficient documentation

## 2015-07-20 DIAGNOSIS — O09522 Supervision of elderly multigravida, second trimester: Secondary | ICD-10-CM | POA: Insufficient documentation

## 2015-07-20 DIAGNOSIS — O26899 Other specified pregnancy related conditions, unspecified trimester: Secondary | ICD-10-CM

## 2015-07-20 LAB — URINALYSIS, ROUTINE W REFLEX MICROSCOPIC
Bilirubin Urine: NEGATIVE
GLUCOSE, UA: NEGATIVE mg/dL
KETONES UR: NEGATIVE mg/dL
Leukocytes, UA: NEGATIVE
Nitrite: NEGATIVE
PH: 5.5 (ref 5.0–8.0)
PROTEIN: 100 mg/dL — AB

## 2015-07-20 LAB — URINE MICROSCOPIC-ADD ON
Bacteria, UA: NONE SEEN
WBC, UA: NONE SEEN WBC/hpf (ref 0–5)

## 2015-07-20 LAB — POCT PREGNANCY, URINE: Preg Test, Ur: POSITIVE — AB

## 2015-07-20 NOTE — Discharge Instructions (Signed)
Back Pain in Pregnancy °Back pain during pregnancy is common. It happens in about half of all pregnancies. It is important for you and your baby that you remain active during your pregnancy. If you feel that back pain is not allowing you to remain active or sleep well, it is time to see your caregiver. Back pain may be caused by several factors related to changes during your pregnancy. Fortunately, unless you had trouble with your back before your pregnancy, the pain is likely to get better after you deliver. °Low back pain usually occurs between the fifth and seventh months of pregnancy. It can, however, happen in the first couple months. Factors that increase the risk of back problems include:  °· Previous back problems. °· Injury to your back. °· Having twins or multiple births. °· A chronic cough. °· Stress. °· Job-related repetitive motions. °· Muscle or spinal disease in the back. °· Family history of back problems, ruptured (herniated) discs, or osteoporosis. °· Depression, anxiety, and panic attacks. °CAUSES  °· When you are pregnant, your body produces a hormone called relaxin. This hormone makes the ligaments connecting the low back and pubic bones more flexible. This flexibility allows the baby to be delivered more easily. When your ligaments are loose, your muscles need to work harder to support your back. Soreness in your back can come from tired muscles. Soreness can also come from back tissues that are irritated since they are receiving less support. °· As the baby grows, it puts pressure on the nerves and blood vessels in your pelvis. This can cause back pain. °· As the baby grows and gets heavier during pregnancy, the uterus pushes the stomach muscles forward and changes your center of gravity. This makes your back muscles work harder to maintain good posture. °SYMPTOMS  °Lumbar pain during pregnancy °Lumbar pain during pregnancy usually occurs at or above the waist in the center of the back. There  may be pain and numbness that radiates into your leg or foot. This is similar to low back pain experienced by non-pregnant women. It usually increases with sitting for long periods of time, standing, or repetitive lifting. Tenderness may also be present in the muscles along your upper back. °Posterior pelvic pain during pregnancy °Pain in the back of the pelvis is more common than lumbar pain in pregnancy. It is a deep pain felt in your side at the waistline, or across the tailbone (sacrum), or in both places. You may have pain on one or both sides. This pain can also go into the buttocks and backs of the upper thighs. Pubic and groin pain may also be present. The pain does not quickly resolve with rest, and morning stiffness may also be present. °Pelvic pain during pregnancy can be brought on by most activities. A high level of fitness before and during pregnancy may or may not prevent this problem. Labor pain is usually 1 to 2 minutes apart, lasts for about 1 minute, and involves a bearing down feeling or pressure in your pelvis. However, if you are at term with the pregnancy, constant low back pain can be the beginning of early labor, and you should be aware of this. °DIAGNOSIS  °X-rays of the back should not be done during the first 12 to 14 weeks of the pregnancy and only when absolutely necessary during the rest of the pregnancy. MRIs do not give off radiation and are safe during pregnancy. MRIs also should only be done when absolutely necessary. °HOME CARE INSTRUCTIONS °· Exercise   as directed by your caregiver. Exercise is the most effective way to prevent or manage back pain. If you have a back problem, it is especially important to avoid sports that require sudden body movements. Swimming and walking are great activities. °· Do not stand in one place for long periods of time. °· Do not wear high heels. °· Sit in chairs with good posture. Use a pillow on your lower back if necessary. Make sure your head  rests over your shoulders and is not hanging forward. °· Try sleeping on your side, preferably the left side, with a pillow or two between your legs. If you are sore after a night's rest, your bed may be too soft. Try placing a board between your mattress and box spring. °· Listen to your body when lifting. If you are experiencing pain, ask for help or try bending your knees more so you can use your leg muscles rather than your back muscles. Squat down when picking up something from the floor. Do not bend over. °· Eat a healthy diet. Try to gain weight within your caregiver's recommendations. °· Use heat or cold packs 3 to 4 times a day for 15 minutes to help with the pain. °· Only take over-the-counter or prescription medicines for pain, discomfort, or fever as directed by your caregiver. °Sudden (acute) back pain °· Use bed rest for only the most extreme, acute episodes of back pain. Prolonged bed rest over 48 hours will aggravate your condition. °· Ice is very effective for acute conditions. °¨ Put ice in a plastic bag. °¨ Place a towel between your skin and the bag. °¨ Leave the ice on for 10 to 20 minutes every 2 hours, or as needed. °· Using heat packs for 30 minutes prior to activities is also helpful. °Continued back pain °See your caregiver if you have continued problems. Your caregiver can help or refer you for appropriate physical therapy. With conditioning, most back problems can be avoided. Sometimes, a more serious issue may be the cause of back pain. You should be seen right away if new problems seem to be developing. Your caregiver may recommend: °· A maternity girdle. °· An elastic sling. °· A back brace. °· A massage therapist or acupuncture. °SEEK MEDICAL CARE IF:  °· You are not able to do most of your daily activities, even when taking the pain medicine you were given. °· You need a referral to a physical therapist or chiropractor. °· You want to try acupuncture. °SEEK IMMEDIATE MEDICAL CARE  IF: °· You develop numbness, tingling, weakness, or problems with the use of your arms or legs. °· You develop severe back pain that is no longer relieved with medicines. °· You have a sudden change in bowel or bladder control. °· You have increasing pain in other areas of the body. °· You develop shortness of breath, dizziness, or fainting. °· You develop nausea, vomiting, or sweating. °· You have back pain which is similar to labor pains. °· You have back pain along with your water breaking or vaginal bleeding. °· You have back pain or numbness that travels down your leg. °· Your back pain developed after you fell. °· You develop pain on one side of your back. You may have a kidney stone. °· You see blood in your urine. You may have a bladder infection or kidney stone. °· You have back pain with blisters. You may have shingles. °Back pain is fairly common during pregnancy but should not be accepted as just part of   the process. Back pain should always be treated as soon as possible. This will make your pregnancy as pleasant as possible. °  °This information is not intended to replace advice given to you by your health care provider. Make sure you discuss any questions you have with your health care provider. °  °Document Released: 04/27/2005 Document Revised: 04/11/2011 Document Reviewed: 06/08/2010 °Elsevier Interactive Patient Education ©2016 [employer] ° ° °Abdominal Pain During Pregnancy °Belly (abdominal) pain is common during pregnancy. Most of the time, it is not a serious problem. Other times, it can be a sign that something is wrong with the pregnancy. Always tell your doctor if you have belly pain. °HOME CARE °Monitor your belly pain for any changes. The following actions may help you feel better: °· Do not have sex (intercourse) or put anything in your vagina until you feel better. °· Rest until your pain stops. °· Drink clear fluids if you feel sick to your stomach (nauseous). Do not eat solid food  until you feel better. °· Only take medicine as told by your doctor. °· Keep all doctor visits as told. °GET HELP RIGHT AWAY IF:  °· You are bleeding, leaking fluid, or pieces of tissue come out of your vagina. °· You have more pain or cramping. °· You keep throwing up (vomiting). °· You have pain when you pee (urinate) or have blood in your pee. °· You have a fever. °· You do not feel your baby moving as much. °· You feel very weak or feel like passing out. °· You have trouble breathing, with or without belly pain. °· You have a very bad headache and belly pain. °· You have fluid leaking from your vagina and belly pain. °· You keep having watery poop (diarrhea). °· Your belly pain does not go away after resting, or the pain gets worse. °MAKE SURE YOU:  °· Understand these instructions. °· Will watch your condition. °· Will get help right away if you are not doing well or get worse. °  °This information is not intended to replace advice given to you by your health care provider. Make sure you discuss any questions you have with your health care provider. °  °Document Released: 01/05/2009 Document Revised: 09/19/2012 Document Reviewed: 08/16/2012 °Elsevier Interactive Patient Education ©2016 [employer] ° °

## 2015-07-20 NOTE — MAU Provider Note (Signed)
MAU HISTORY AND PHYSICAL  Chief Complaint:  Abdominal Pain and Back Pain   Rachel Wu is a 40 y.o.  [id number] at [redacted]w[redacted]d presenting for Abdominal Pain and Back Pain  Has had 3 months bilateral pelvic pain, cramping in quality. Not getting better. In area of c/s scar from this past September. No vaginal bleeding. No dysuria or hematuria or fevers. No vaginal discharge. Not aware that she is pregnant - new dx today.  Low back pain. Diffused. Radiates to back of thighs. No weakness or numbness. No upper back or flank pain.   Past Medical History  Diagnosis Date  . Anemia   . Seasonal allergies   . GERD (gastroesophageal reflux disease)     Past Surgical History  Procedure Laterality Date  . Cesarean section  8119,1478  . Dilation and curettage of uterus  04/2010    missed ab  . Cesarean section  04/07/2011    Procedure: CESAREAN SECTION;  Surgeon: Philip Aspen, DO;  Location: [facility];  Service: Gynecology;  Laterality: N/A;  Repeat 04/14/11  . Cesarean section N/A 10/29/2014    Procedure: REPEAT CESAREAN SECTION;  Surgeon: Kathreen Cosier, MD;  Location: [facility];  Service: Obstetrics;  Laterality: N/A;    Family History  Problem Relation Age of Onset  . Hypertension Mother   . Diabetes Father   . Hypertension Father     Social History  Substance Use Topics  . Smoking status: Never Smoker   . Smokeless tobacco: Never Used  . Alcohol Use: No    No Known Allergies  Prescriptions prior to admission  Medication Sig Dispense Refill Last Dose  . ibuprofen (ADVIL,MOTRIN) 200 MG tablet Take 400 mg by mouth every 6 (six) hours as needed for headache or mild pain.   Past Week at Unknown time  . OVER THE COUNTER MEDICATION Take 1 capsule by mouth 3 (three) times daily. Patient has been taking OTC diet pills.   Past Week at Unknown time    Review of Systems - Negative except for what is mentioned in HPI.  Physical Exam  Blood pressure 121/80, pulse 107, temperature 98.4 F  (36.9 C), temperature source Oral, resp. rate 18, weight 272 lb 0.6 oz (123.397 kg), last menstrual period 01/29/2015, not currently breastfeeding. GENERAL: Well-developed, well-nourished female in no acute distress.  LUNGS: Clear to auscultation bilaterally.  HEART: Regular rate and rhythm. ABDOMEN: Soft, nontender, nondistended, gravid.  EXTREMITIES: Nontender, no edema, 2+ distal pulses. Neuro: 5/5 lower strength, distal sensation to light touch intact bilaterally. FHTs 130s   Labs: Results for orders placed or performed during the hospital encounter of 07/20/15 (from the past 24 hour(s))  Urinalysis, Routine w reflex microscopic (not at [facility])   Collection Time: 07/20/15  4:15 PM  Result Value Ref Range   Color, Urine YELLOW YELLOW   APPearance HAZY (A) CLEAR   Specific Gravity, Urine >1.030 (H) 1.005 - 1.030   pH 5.5 5.0 - 8.0   Glucose, UA NEGATIVE NEGATIVE mg/dL   Hgb urine dipstick TRACE (A) NEGATIVE   Bilirubin Urine NEGATIVE NEGATIVE   Ketones, ur NEGATIVE NEGATIVE mg/dL   Protein, ur 295 (A) NEGATIVE mg/dL   Nitrite NEGATIVE NEGATIVE   Leukocytes, UA NEGATIVE NEGATIVE  Urine microscopic-add on   Collection Time: 07/20/15  4:15 PM  Result Value Ref Range   Squamous Epithelial / LPF 0-5 (A) NONE SEEN   WBC, UA NONE SEEN 0 - 5 WBC/hpf   RBC / HPF 0-5 0 -  5 RBC/hpf   Bacteria, UA NONE SEEN NONE SEEN   Urine-Other MUCOUS PRESENT   Pregnancy, urine POC   Collection Time: 07/20/15  4:36 PM  Result Value Ref Range   Preg Test, Ur POSITIVE (A) NEGATIVE    Imaging Studies:  No results found.  Assessment/Plan: Leighton ParodyGloria K Heiss is  40 y.o. [id number] at 1873w4d presents with...  # pelvic pain - upt positive today, fhts positive. Holding on u/s to eval for ectopic as pain is mild and has been present for 3 months without acute exacerbation. No associated symptoms. ua not suggestive of infection - pelvic pain in pregnancy return precautions  # back pain - no red flag  symptoms. ua not suggestive of infection - tylenol, rest, heat, belly band, red flag return precautions  # pregnancy - not desired. [facility] - [facility] for dating, can cancel [facility].   Cherrie Gauzeoah B Wouk 6/19/20175:13 PM

## 2015-07-20 NOTE — MAU Note (Signed)
Been having pain in lower abd, off and on the past 3 months, c/s area- c/s was 8 months ago.  Pain in low back where needle was stuck in back.   Having some numbness in middle 2 fingers on rt hand; since delivery.

## 2015-07-21 ENCOUNTER — Other Ambulatory Visit: Payer: Self-pay | Admitting: Advanced Practice Midwife

## 2015-07-21 DIAGNOSIS — R102 Pelvic and perineal pain: Principal | ICD-10-CM

## 2015-07-21 DIAGNOSIS — O26891 Other specified pregnancy related conditions, first trimester: Secondary | ICD-10-CM

## 2015-07-23 ENCOUNTER — Other Ambulatory Visit: Payer: Self-pay | Admitting: Advanced Practice Midwife

## 2015-07-23 ENCOUNTER — Ambulatory Visit (HOSPITAL_COMMUNITY)
Admission: RE | Admit: 2015-07-23 | Discharge: 2015-07-23 | Disposition: A | Discharge status: Home / Self Care | Payer: Medicaid Other | Source: Ambulatory Visit | Attending: Maternal and Fetal Medicine | Admitting: Maternal and Fetal Medicine

## 2015-07-23 ENCOUNTER — Ambulatory Visit (HOSPITAL_COMMUNITY): Admission: RE | Admit: 2015-07-23 | Payer: Medicaid Other | Source: Ambulatory Visit

## 2015-07-23 DIAGNOSIS — E669 Obesity, unspecified: Secondary | ICD-10-CM | POA: Insufficient documentation

## 2015-07-23 DIAGNOSIS — O99212 Obesity complicating pregnancy, second trimester: Secondary | ICD-10-CM

## 2015-07-23 DIAGNOSIS — Z1389 Encounter for screening for other disorder: Secondary | ICD-10-CM

## 2015-07-23 DIAGNOSIS — Z3A19 19 weeks gestation of pregnancy: Secondary | ICD-10-CM

## 2015-07-23 DIAGNOSIS — Z36 Encounter for antenatal screening of mother: Secondary | ICD-10-CM | POA: Diagnosis present

## 2015-07-23 DIAGNOSIS — O34219 Maternal care for unspecified type scar from previous cesarean delivery: Secondary | ICD-10-CM

## 2015-09-28 ENCOUNTER — Telehealth: Payer: Self-pay | Admitting: Obstetrics and Gynecology

## 2015-09-28 DIAGNOSIS — O4400 Placenta previa specified as without hemorrhage, unspecified trimester: Secondary | ICD-10-CM | POA: Insufficient documentation

## 2015-09-28 NOTE — Telephone Encounter (Signed)
Entered in error

## 2015-10-10 ENCOUNTER — Encounter: Payer: Self-pay | Admitting: *Deleted

## 2015-10-10 LAB — LAB REPORT - SCANNED: Pap: NEGATIVE

## 2015-11-11 ENCOUNTER — Encounter (HOSPITAL_COMMUNITY): Payer: Self-pay | Admitting: Family Medicine

## 2015-11-11 ENCOUNTER — Emergency Department (HOSPITAL_COMMUNITY)
Admission: EM | Admit: 2015-11-11 | Discharge: 2015-11-12 | Disposition: A | Discharge status: Home / Self Care | Payer: Medicaid Other | Attending: Emergency Medicine | Admitting: Emergency Medicine

## 2015-11-11 DIAGNOSIS — Z9889 Other specified postprocedural states: Secondary | ICD-10-CM | POA: Insufficient documentation

## 2015-11-11 DIAGNOSIS — Z98891 History of uterine scar from previous surgery: Secondary | ICD-10-CM

## 2015-11-11 DIAGNOSIS — Z79899 Other long term (current) drug therapy: Secondary | ICD-10-CM | POA: Diagnosis not present

## 2015-11-11 DIAGNOSIS — T8189XA Other complications of procedures, not elsewhere classified, initial encounter: Secondary | ICD-10-CM

## 2015-11-11 DIAGNOSIS — O9089 Other complications of the puerperium, not elsewhere classified: Secondary | ICD-10-CM | POA: Insufficient documentation

## 2015-11-11 DIAGNOSIS — Z791 Long term (current) use of non-steroidal anti-inflammatories (NSAID): Secondary | ICD-10-CM | POA: Diagnosis not present

## 2015-11-11 MED ORDER — CEPHALEXIN 500 MG PO CAPS: 500.0000 mg | ORAL_CAPSULE | Freq: Four times a day (QID) | ORAL | 0 refills | Status: DC

## 2015-11-11 NOTE — ED Triage Notes (Signed)
Patient had a C-section and total hyste[facility] on 11-03-2015 [facility]. Pt reports she was lying on the bed when she started experiencing clear fluid from the surgical site. No signs of redness noted. Denies any fever.

## 2015-11-11 NOTE — ED Notes (Signed)
PA at bedside.

## 2015-11-11 NOTE — ED Provider Notes (Signed)
[facility]-EMERGENCY DEPT Provider Note   CSN: [id number] Arrival date & time: 11/11/15  2150 By signing my name below, I, Levon HedgerElizabeth Hall, attest that this documentation has been prepared under the direction and in the presence of non-physician practitioner, Antony MaduraKelly Hyrum Shaneyfelt, PA-C Electronically Signed: Levon HedgerElizabeth Hall, Scribe. 11/11/2015. 11:23 PM.    History   Chief Complaint Chief Complaint  Patient presents with  . Wound Dehiscence   HPI Rachel Wu is a 40 y.o. female who presents to the Emergency Department complaining of sudden onset drainage from c-section site today PTA. Pt had a caesarean and total hysterectomy on 11/03/15 [facility]. Today she noticed pink tinged, clear drainage front the surgical site while she was lying on her bed. No alleviating or modifying factors noted. She has an appointment at her OB in two weeks. Pt denies any injury, fever or pain. She has no other complaints at this time.  The history is provided by the patient. No language interpreter was used.    Past Medical History:  Diagnosis Date  . Anemia   . GERD (gastroesophageal reflux disease)   . Seasonal allergies     Patient Active Problem List   Diagnosis Date Noted  . 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 S/P cesarean section 10/29/2014  . [redacted] weeks gestation of pregnancy   . [redacted] weeks gestation of pregnancy   . AMA (advanced maternal age) multigravida 35+   . Encounter for fetal anatomic survey   . Obesity complicating pregnancy in second trimester   . Abdominal pain, left lower quadrant 03/27/2013  . Gallstone 03/27/2013   Past Surgical History:  Procedure Laterality Date  . ABDOMINAL HYSTERECTOMY    . CESAREAN SECTION  2000,2007  . CESAREAN SECTION  04/07/2011   Procedure: CESAREAN SECTION;  Surgeon: Philip AspenSidney Callahan, DO;  Location: [facility];  Service: Gynecology;  Laterality: N/A;  Repeat 04/14/11  . CESAREAN SECTION N/A 10/29/2014   Procedure: REPEAT CESAREAN SECTION;  Surgeon:  Kathreen CosierBernard A Marshall, MD;  Location: [facility];  Service: Obstetrics;  Laterality: N/A;  . DILATION AND CURETTAGE OF UTERUS  04/2010   missed ab    OB History    Gravida Para Term Preterm AB Living   8 4 4  0 3 4   SAB TAB Ectopic Multiple Live Births   2 1 0 0 4     Home Medications    Prior to Admission medications   Not on File    Family History Family History  Problem Relation Age of Onset  . Hypertension Mother   . Diabetes Father   . Hypertension Father    Social History Social History  Substance Use Topics  . Smoking status: Never Smoker  . Smokeless tobacco: Never Used  . Alcohol use No    Allergies   Review of patient's allergies indicates no known allergies.  Review of Systems Review of Systems 10 systems reviewed and all are negative for acute change except as noted in the HPI.    Physical Exam Updated Vital Signs BP 146/90 (BP Location: Right Arm)   Pulse 76   Temp 98.4 F (36.9 C) (Oral)   Resp 18   Ht 5\' 6"  (1.676 m)   Wt 276 lb (125.2 kg)   LMP 01/29/2015 Comment: had baby in Sept, had period in Nov and Dec, none since  SpO2 99%   Breastfeeding? Unknown Comment: Had C-Section on 11/03/2015  BMI 44.55 kg/m   Physical Exam  Constitutional: She is oriented to  person, place, and time. She appears well-developed and well-nourished. No distress.  Nontoxic and in NAD  HENT:  Head: Normocephalic and atraumatic.  Eyes: Conjunctivae and EOM are normal. No scleral icterus.  Neck: Normal range of motion.  Cardiovascular: Normal rate, regular rhythm and intact distal pulses.   Pulmonary/Chest: Effort normal. No respiratory distress. She has no wheezes.  Respirations even and unlabored  Abdominal: Soft. She exhibits no mass. There is no tenderness. There is no guarding.    Abdomen soft, obese, and nontender.  Musculoskeletal: Normal range of motion.  Neurological: She is alert and oriented to person, place, and time.  Skin: Skin is warm and dry. No  rash noted. She is not diaphoretic. No erythema. No pallor.  Psychiatric: She has a normal mood and affect. Her behavior is normal.  Nursing note and vitals reviewed.   ED Treatments / Results  DIAGNOSTIC STUDIES:  Oxygen Saturation is 99% on RA, normal by my interpretation.    COORDINATION OF CARE:  11:24 PM Discussed treatment plan with pt at bedside and pt agreed to plan.   Labs (all labs ordered are listed, but only abnormal results are displayed) Labs Reviewed - No data to display  EKG  EKG Interpretation None       Radiology No results found.  Procedures Procedures (including critical care time)  Medications Ordered in ED Medications - No data to display   Initial Impression / Assessment and Plan / ED Course  I have reviewed the triage vital signs and the nursing notes.  Pertinent labs & imaging results that were available during my care of the patient were reviewed by me and considered in my medical decision making (see chart for details).  Clinical Course    Patient presenting over concern for drainage from C-section scar. Patient with no evidence of secondary infection or cellulitis. No purulent drainage appreciated. Patient is afebrile; VSS. There is mild serous and serosanguinous drainage which appears appropriate for the wound and degree of healing. No dehiscence of the incision site. Patient requesting antibiotic, despite reassurance that no infection appears to be present; started on Keflex. I have advised outpatient OBGYN follow up. Return precautions discussed and provided. Patient discharged in stable condition with no unaddressed concerns.   Final Clinical Impressions(s) / ED Diagnoses   Final diagnoses:  Draining postoperative wound, initial encounter  Hx of cesarean section   New Prescriptions Discharge Medication List as of 11/11/2015 11:54 PM    START taking these medications   Details  cephALEXin (KEFLEX) 500 MG capsule Take 1 capsule  (500 mg total) by mouth 4 (four) times daily., Starting Wed 11/11/2015, Print        I personally performed the services described in this documentation, which was scribed in my presence. The recorded information has been reviewed and is accurate.      Antony Madura, PA-C 11/16/15 0031    Melene Plan, DO 11/16/15 [phone]

## 2015-11-11 NOTE — Discharge Instructions (Signed)
Avoid strenuous activity or heavy lifting. Have your wound rechecked by your OBGYN in 1 week. [facility] for any new or concerning symptoms. You may take Keflex to prevent infection; however there is no evidence of infection to your wound site today. Take tylenol or ibuprofen for pain. Be sure to keep your wound clean and dry; change your dressing at least once per day.

## 2016-06-15 ENCOUNTER — Encounter (HOSPITAL_COMMUNITY): Payer: Self-pay | Admitting: Emergency Medicine

## 2016-06-15 ENCOUNTER — Ambulatory Visit (HOSPITAL_COMMUNITY)
Admission: EM | Admit: 2016-06-15 | Discharge: 2016-06-15 | Disposition: A | Discharge status: Home / Self Care | Payer: Medicaid Other | Attending: Family Medicine | Admitting: Family Medicine

## 2016-06-15 DIAGNOSIS — R3915 Urgency of urination: Secondary | ICD-10-CM | POA: Diagnosis not present

## 2016-06-15 DIAGNOSIS — R109 Unspecified abdominal pain: Secondary | ICD-10-CM | POA: Insufficient documentation

## 2016-06-15 DIAGNOSIS — E669 Obesity, unspecified: Secondary | ICD-10-CM | POA: Diagnosis not present

## 2016-06-15 DIAGNOSIS — K219 Gastro-esophageal reflux disease without esophagitis: Secondary | ICD-10-CM | POA: Diagnosis not present

## 2016-06-15 DIAGNOSIS — N12 Tubulo-interstitial nephritis, not specified as acute or chronic: Secondary | ICD-10-CM | POA: Diagnosis not present

## 2016-06-15 DIAGNOSIS — M940 Chondrocostal junction syndrome [Tietze]: Secondary | ICD-10-CM | POA: Diagnosis not present

## 2016-06-15 DIAGNOSIS — J302 Other seasonal allergic rhinitis: Secondary | ICD-10-CM | POA: Insufficient documentation

## 2016-06-15 MED ORDER — CEFTRIAXONE SODIUM 1 G IJ SOLR
INTRAMUSCULAR | Status: AC
  Filled 2016-06-15: qty 10

## 2016-06-15 MED ORDER — CEPHALEXIN 500 MG PO CAPS: 500.0000 mg | ORAL_CAPSULE | Freq: Four times a day (QID) | ORAL | 0 refills | Status: DC

## 2016-06-15 MED ORDER — ACETAMINOPHEN 325 MG PO TABS
650.0000 mg | ORAL_TABLET | Freq: Once | ORAL | Status: AC
  Administered 2016-06-15: 650 mg via ORAL

## 2016-06-15 MED ORDER — CEFTRIAXONE SODIUM 1 G IJ SOLR
1.0000 g | Freq: Once | INTRAMUSCULAR | Status: AC
  Administered 2016-06-15: 1 g via INTRAMUSCULAR

## 2016-06-15 MED ORDER — ACETAMINOPHEN 325 MG PO TABS
ORAL_TABLET | ORAL | Status: AC
  Filled 2016-06-15: qty 2

## 2016-06-15 NOTE — ED Triage Notes (Signed)
Pt c/o constant abd pain onset 2 days associated w/HA, chills, left rib pain, urinary urgency, fevers  Hx of c-section/hysterectomy 7 months ago  A&O x4... NAD... Ambulatory

## 2016-06-15 NOTE — Discharge Instructions (Signed)
You have a urinary tract infection which likely has ascended to the kidneys. It is important for you to start taking the antibiotic today and make sure you take all of it as directed. Drink plenty of fluids and stay well-hydrated. He may also take AZO standard to help with urinary discomfort. It will turn her urine a reddish orange. If he developed vomiting, unable to take your medicine, unable to drink fluids, increased fever, feeling sick or or getting worse in any other way go to the emergency department promptly. The pain in your left side is likely due to inflammation of the cartilage at the bottom of your ribs. Taking ibuprofen may help with this including applying cold packs or ice packs to the area.

## 2016-06-15 NOTE — ED Provider Notes (Signed)
CSN: [id number]     Arrival date & time 06/15/16  1647 History   First MD Initiated Contact with Patient 06/15/16 1756     Chief Complaint  Patient presents with  . Abdominal Pain   (Consider location/radiation/quality/duration/timing/severity/associated sxs/prior Treatment) 41 year old obese female complaining of left low lateral abdominal pain associated with chills and headache. She is also complaining of urinary urgency, small volume voids for 3 days. She states she has had a subjective fever at home for the past couple of days. Currently 100.4.      Past Medical History:  Diagnosis Date  . Anemia   . GERD (gastroesophageal reflux disease)   . Seasonal allergies    Past Surgical History:  Procedure Laterality Date  . ABDOMINAL HYSTERECTOMY    . CESAREAN SECTION  2000,2007  . CESAREAN SECTION  04/07/2011   Procedure: CESAREAN SECTION;  Surgeon: Philip Aspen, DO;  Location: [facility];  Service: Gynecology;  Laterality: N/A;  Repeat 04/14/11  . CESAREAN SECTION N/A 10/29/2014   Procedure: REPEAT CESAREAN SECTION;  Surgeon: Kathreen Cosier, MD;  Location: [facility];  Service: Obstetrics;  Laterality: N/A;  . DILATION AND CURETTAGE OF UTERUS  04/2010   missed ab   Family History  Problem Relation Age of Onset  . Hypertension Mother   . Diabetes Father   . Hypertension Father    Social History  Substance Use Topics  . Smoking status: Never Smoker  . Smokeless tobacco: Never Used  . Alcohol use No   OB History    Gravida Para Term Preterm AB Living   8 4 4  0 3 4   SAB TAB Ectopic Multiple Live Births   2 1 0 0 4     Review of Systems  Constitutional: Positive for chills and fever. Negative for activity change and fatigue.  HENT: Negative.   Respiratory: Negative.   Cardiovascular: Negative.   Gastrointestinal: Negative.   Genitourinary: Positive for frequency and urgency. Negative for vaginal discharge.       Mild low mid pelvic discomfort.  Neurological: Positive  for headaches.  All other systems reviewed and are negative.   Allergies  Patient has no known allergies.  Home Medications   Prior to Admission medications   Not on File   Meds Ordered and Administered this Visit   Medications  cefTRIAXone (ROCEPHIN) injection 1 g (not administered)  acetaminophen (TYLENOL) tablet 650 mg (650 mg Oral Given 06/15/16 1741)    BP 125/80 (BP Location: Right Arm)   Pulse (!) 114   Temp (!) 100.4 F (38 C) (Oral)   Resp 20   SpO2 100%   Breastfeeding? No  No data found.   Physical Exam  Constitutional: She is oriented to person, place, and time. She appears well-developed and well-nourished. No distress.  Eyes: EOM are normal.  Neck: Normal range of motion. Neck supple.  Cardiovascular: Normal rate.   Pulmonary/Chest: Effort normal. No respiratory distress. She exhibits tenderness.  Tenderness across the left anterior and lateral costal margin. No abdominal tenderness. No CVAT.  Abdominal: Soft. She exhibits no mass. There is no tenderness. There is no rebound and no guarding.  Musculoskeletal: She exhibits no edema.  Neurological: She is alert and oriented to person, place, and time. She exhibits normal muscle tone.  Skin: Skin is warm and dry.  Psychiatric: She has a normal mood and affect.  Nursing note and vitals reviewed.   Urgent Care Course     Procedures (including critical care time)  Labs Review Labs Reviewed  URINE CULTURE    Imaging Review No results found.   Visual Acuity Review  Right Eye Distance:   Left Eye Distance:   Bilateral Distance:    Right Eye Near:   Left Eye Near:    Bilateral Near:         MDM   1. Pyelonephritis   2. Costochondritis    You have a urinary tract infection which likely has ascended to the kidneys. It is important for you to start taking the antibiotic today and make sure you take all of it as directed. Drink plenty of fluids and stay well-hydrated. He may also take AZO  standard to help with urinary discomfort. It will turn her urine a reddish orange. If he developed vomiting, unable to take your medicine, unable to drink fluids, increased fever, feeling sick or or getting worse in any other way go to the emergency department promptly. The pain in your left side is likely due to inflammation of the cartilage at the bottom of your ribs. Taking ibuprofen may help with this including applying cold packs or ice packs to the area. Meds ordered this encounter  Medications  . acetaminophen (TYLENOL) tablet 650 mg  . cefTRIAXone (ROCEPHIN) injection 1 g   Keflex 500 qid sent to [facility]    Burhanuddin Kohlmann, NP 06/15/16 [phone]

## 2016-06-18 LAB — URINE CULTURE

## 2018-08-20 ENCOUNTER — Other Ambulatory Visit: Payer: Self-pay

## 2018-08-20 ENCOUNTER — Encounter (HOSPITAL_COMMUNITY): Payer: Self-pay

## 2018-08-20 ENCOUNTER — Ambulatory Visit (HOSPITAL_COMMUNITY)
Admission: EM | Admit: 2018-08-20 | Discharge: 2018-08-20 | Disposition: A | Discharge status: Home / Self Care | Payer: Self-pay | Attending: Family Medicine | Admitting: Family Medicine

## 2018-08-20 DIAGNOSIS — Z20828 Contact with and (suspected) exposure to other viral communicable diseases: Secondary | ICD-10-CM | POA: Insufficient documentation

## 2018-08-20 DIAGNOSIS — Z79899 Other long term (current) drug therapy: Secondary | ICD-10-CM | POA: Insufficient documentation

## 2018-08-20 DIAGNOSIS — R1030 Lower abdominal pain, unspecified: Secondary | ICD-10-CM | POA: Insufficient documentation

## 2018-08-20 DIAGNOSIS — Z90710 Acquired absence of both cervix and uterus: Secondary | ICD-10-CM | POA: Insufficient documentation

## 2018-08-20 DIAGNOSIS — Z833 Family history of diabetes mellitus: Secondary | ICD-10-CM | POA: Insufficient documentation

## 2018-08-20 DIAGNOSIS — R197 Diarrhea, unspecified: Secondary | ICD-10-CM

## 2018-08-20 NOTE — Discharge Instructions (Signed)
Small frequent sips of fluids- Pedialyte, Gatorade, water, broth- to maintain hydration.   Bland diet as tolerated. Tylenol as needed for pain.  Will notify of any positive findings from your covid testing.  I recommend isolation until results are back, due to your new onset of symptoms.   Please go to the ER for further evaluation if develop increased pain, fever, blood or black in stool or otherwise worsening. If persistent symptoms may return or follow up with your primary care provider.

## 2018-08-20 NOTE — ED Triage Notes (Signed)
Patient presents to Urgent Care with complaints of lower abdominal pain and diarrhea since 3 days ago. Patient reports watery diarrhea, has had 3-4 episodes today, denies recent courses of antibiotics.

## 2018-08-20 NOTE — ED Provider Notes (Signed)
[facility]    CSN: [id number] Arrival date & time: 08/20/18  1630     History   Chief Complaint Chief Complaint  Patient presents with  . Diarrhea    HPI Rachel Wu is a 43 y.o. female.   Rachel ParodyGloria K Wu presents with complaints of low abdominal pain and diarrhea which started 2 days ago. Today, diarrhea has improved some. Low abdominal pain started last night. Total hysterectomy. Pain is where scarring from when she had her hysterectomy. 4 episodes today, of clear watery. No recent antibiotics. No nausea or vomiting. Yesterday had >6 episodes. Drinking fluids, that is what seems to pass, hasn't been eating food. Occasionally has had diarrhea, but never lasts longer than 3-4 days. No fevers. No weakness or dizziness. Normal urination. No travel. Had eaten at a restaurant prior to onset of symptoms. No others have been ill. Took 500mg  tylenol which didn't seem to help. Abdominal pain isn't worse, but not better. No URI symptoms. 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 No previous colonoscopy.    ROS per HPI, negative if not otherwise mentioned.      Past Medical History:  Diagnosis Date  . Anemia   . GERD (gastroesophageal reflux disease)   . Seasonal allergies     Patient Active Problem List   Diagnosis Date Noted  . 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 S/P cesarean section 10/29/2014  . [redacted] weeks gestation of pregnancy   . [redacted] weeks gestation of pregnancy   . AMA (advanced maternal age) multigravida 35+   . Encounter for fetal anatomic survey   . Obesity complicating pregnancy in second trimester   . Abdominal pain, left lower quadrant 03/27/2013  . Gallstone 03/27/2013    Past Surgical History:  Procedure Laterality Date  . ABDOMINAL HYSTERECTOMY    . CESAREAN SECTION  2000,2007  . CESAREAN SECTION  04/07/2011   Procedure: CESAREAN SECTION;  Surgeon: Philip AspenSidney Callahan, DO;  Location: [facility];  Service:  Gynecology;  Laterality: N/A;  Repeat 04/14/11  . CESAREAN SECTION N/A 10/29/2014   Procedure: REPEAT CESAREAN SECTION;  Surgeon: Kathreen CosierBernard A Marshall, MD;  Location: [facility];  Service: Obstetrics;  Laterality: N/A;  . DILATION AND CURETTAGE OF UTERUS  04/2010   missed ab    OB History    Gravida  8   Para  4   Term  4   Preterm  0   AB  3   Living  4     SAB  2   TAB  1   Ectopic  0   Multiple  0   Live Births  4            Home Medications    Prior to Admission medications   Medication Sig Start Date End Date Taking? Authorizing Provider  cephALEXin (KEFLEX) 500 MG capsule Take 1 capsule (500 mg total) by mouth 4 (four) times daily. 06/15/16   Hayden RasmussenMabe, David, NP    Family History Family History  Problem Relation Age of Onset  . Hypertension Mother   . Diabetes Father   . Hypertension Father     Social History Social History   Tobacco Use  . Smoking status: Never Smoker  . Smokeless tobacco: Never Used  Substance Use Topics  . Alcohol use: No  . Drug use: No     Allergies   Patient has no known allergies.   Review of Systems  Review of Systems   Physical Exam Triage Vital Signs ED Triage Vitals  Enc Vitals Group     BP 08/20/18 1811 105/63     Pulse Rate 08/20/18 1811 82     Resp 08/20/18 1811 17     Temp 08/20/18 1811 98.7 F (37.1 C)     Temp Source 08/20/18 1811 Oral     SpO2 08/20/18 1811 100 %     Weight --      Height --      Head Circumference --      Peak Flow --      Pain Score 08/20/18 1809 4     Pain Loc --      Pain Edu? --      Excl. in GC? --    No data found.  Updated Vital Signs BP 105/63 (BP Location: Left Arm)   Pulse 82   Temp 98.7 F (37.1 C) (Oral)   Resp 17   LMP 01/29/2015 Comment: had baby in Sept, had period in Nov and Dec, none since  SpO2 100%    Physical Exam Constitutional:      General: She is not in acute distress.    Appearance: She is well-developed.  Cardiovascular:     Rate and  Rhythm: Normal rate.  Pulmonary:     Effort: Pulmonary effort is normal.  Abdominal:     Palpations: Abdomen is soft.     Tenderness: There is abdominal tenderness in the suprapubic area. There is no right CVA tenderness, left CVA tenderness, guarding or rebound. Negative signs include Murphy's sign.     Comments: Tenderness at suprapubic scaring   Skin:    General: Skin is warm and dry.  Neurological:     Mental Status: She is alert and oriented to person, place, and time.      UC Treatments / Results  Labs (all labs ordered are listed, but only abnormal results are displayed) Labs Reviewed  NOVEL CORONAVIRUS, NAA (HOSPITAL ORDER, SEND-OUT TO REF LAB)    EKG   Radiology No results found.  Procedures Procedures (including critical care time)  Medications Ordered in UC Medications - No data to display  Initial Impression / Assessment and Plan / UC Course  I have reviewed the triage vital signs and the nursing notes.  Pertinent labs & imaging results that were available during my care of the patient were reviewed by me and considered in my medical decision making (see chart for details).     Non toxic. Tolerating liquids. Afebrile. No tachycardia. No worsening of pain. Has had similar episodes in the past. Diarrhea is improving. Supportive cares recommended. Appendicitis vs diverticulitis vs gastroenteritis considered. 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 Return precautions provided. Patient verbalized understanding and agreeable to plan.   Final Clinical Impressions(s) / UC Diagnoses   Final diagnoses:  Diarrhea, unspecified type  Lower abdominal pain     Discharge Instructions     Small frequent sips of fluids- Pedialyte, Gatorade, water, broth- to maintain hydration.   Bland diet as tolerated. Tylenol as needed for pain.  Will notify of any positive findings from your covid testing.  I recommend isolation until results are  back, due to your new onset of symptoms.   Please go to the ER for further evaluation if develop increased pain, fever, blood or black in stool or otherwise worsening. If persistent symptoms may return or follow up with your primary care provider.  ED Prescriptions    None     Controlled Substance Prescriptions [place] Controlled Substance Registry consulted? Not Applicable   Zigmund Gottron, NP 08/20/18 [phone]

## 2018-08-22 LAB — NOVEL CORONAVIRUS, NAA (HOSP ORDER, SEND-OUT TO REF LAB; TAT 18-24 HRS): SARS-CoV-2, NAA: NOT DETECTED

## 2018-08-23 ENCOUNTER — Encounter (INDEPENDENT_AMBULATORY_CARE_PROVIDER_SITE_OTHER): Payer: Self-pay | Admitting: Physician Assistant

## 2018-08-23 NOTE — Telephone Encounter (Signed)
Please advise 

## 2018-08-26 ENCOUNTER — Telehealth (HOSPITAL_COMMUNITY): Payer: Self-pay | Admitting: Emergency Medicine

## 2018-08-26 NOTE — Telephone Encounter (Signed)
Your test for COVID-19 was negative.  Please continue good preventive care measures, including:  frequent hand-washing, avoid touching your face, cover coughs/sneezes, stay out of crowds and keep a 6 foot distance from others.  If you develop fever/cough/breathlessness, please stay home for 10 days and until you have had 3 consecutive days with cough/breathlessness improving and without fever (without taking a fever reducer). Go to the nearest hospital ED tent for assessment if fever/cough/breathlessness are severe or illness seems like a threat to life.  Patient contacted and made aware of all results, all questions answered.  Work note printed for her to pick up in the morning.

## 2018-11-05 DIAGNOSIS — H40033 Anatomical narrow angle, bilateral: Secondary | ICD-10-CM | POA: Diagnosis not present

## 2018-11-05 DIAGNOSIS — H16223 Keratoconjunctivitis sicca, not specified as Sjogren's, bilateral: Secondary | ICD-10-CM | POA: Diagnosis not present

## 2018-11-07 DIAGNOSIS — M545 Low back pain: Secondary | ICD-10-CM | POA: Diagnosis not present

## 2018-11-13 DIAGNOSIS — Z1239 Encounter for other screening for malignant neoplasm of breast: Secondary | ICD-10-CM | POA: Diagnosis not present

## 2018-11-13 DIAGNOSIS — Z131 Encounter for screening for diabetes mellitus: Secondary | ICD-10-CM | POA: Diagnosis not present

## 2018-11-13 DIAGNOSIS — E559 Vitamin D deficiency, unspecified: Secondary | ICD-10-CM | POA: Diagnosis not present

## 2018-11-13 DIAGNOSIS — M545 Low back pain: Secondary | ICD-10-CM | POA: Diagnosis not present

## 2018-11-13 DIAGNOSIS — Z6841 Body Mass Index (BMI) 40.0 and over, adult: Secondary | ICD-10-CM | POA: Diagnosis not present

## 2018-11-13 DIAGNOSIS — Z0000 Encounter for general adult medical examination without abnormal findings: Secondary | ICD-10-CM | POA: Diagnosis not present

## 2018-11-13 DIAGNOSIS — Z13220 Encounter for screening for lipoid disorders: Secondary | ICD-10-CM | POA: Diagnosis not present

## 2018-12-20 DIAGNOSIS — Z6841 Body Mass Index (BMI) 40.0 and over, adult: Secondary | ICD-10-CM | POA: Diagnosis not present

## 2018-12-20 DIAGNOSIS — J668 Airway disease due to other specific organic dusts: Secondary | ICD-10-CM | POA: Diagnosis not present

## 2018-12-20 DIAGNOSIS — E559 Vitamin D deficiency, unspecified: Secondary | ICD-10-CM | POA: Diagnosis not present

## 2018-12-29 DIAGNOSIS — H5213 Myopia, bilateral: Secondary | ICD-10-CM | POA: Diagnosis not present

## 2019-01-05 DIAGNOSIS — H1013 Acute atopic conjunctivitis, bilateral: Secondary | ICD-10-CM | POA: Diagnosis not present

## 2019-04-05 DIAGNOSIS — G44209 Tension-type headache, unspecified, not intractable: Secondary | ICD-10-CM | POA: Diagnosis not present

## 2019-04-05 DIAGNOSIS — H5203 Hypermetropia, bilateral: Secondary | ICD-10-CM | POA: Diagnosis not present

## 2019-04-17 ENCOUNTER — Other Ambulatory Visit: Payer: Self-pay | Admitting: Internal Medicine

## 2019-04-17 DIAGNOSIS — Z1231 Encounter for screening mammogram for malignant neoplasm of breast: Secondary | ICD-10-CM

## 2019-04-23 DIAGNOSIS — Z124 Encounter for screening for malignant neoplasm of cervix: Secondary | ICD-10-CM | POA: Diagnosis not present

## 2019-04-23 DIAGNOSIS — J302 Other seasonal allergic rhinitis: Secondary | ICD-10-CM | POA: Diagnosis not present

## 2019-04-23 DIAGNOSIS — M545 Low back pain: Secondary | ICD-10-CM | POA: Diagnosis not present

## 2019-04-23 DIAGNOSIS — E559 Vitamin D deficiency, unspecified: Secondary | ICD-10-CM | POA: Diagnosis not present

## 2019-04-24 ENCOUNTER — Other Ambulatory Visit: Payer: Self-pay

## 2019-04-24 ENCOUNTER — Ambulatory Visit
Admission: RE | Admit: 2019-04-24 | Discharge: 2019-04-24 | Disposition: A | Discharge status: Home / Self Care | Payer: Medicaid Other | Source: Ambulatory Visit | Attending: Internal Medicine | Admitting: Internal Medicine

## 2019-04-24 DIAGNOSIS — Z1231 Encounter for screening mammogram for malignant neoplasm of breast: Secondary | ICD-10-CM

## 2019-05-13 DIAGNOSIS — U071 COVID-19: Secondary | ICD-10-CM | POA: Diagnosis not present

## 2019-05-21 DIAGNOSIS — J302 Other seasonal allergic rhinitis: Secondary | ICD-10-CM | POA: Diagnosis not present

## 2019-05-21 DIAGNOSIS — Z6841 Body Mass Index (BMI) 40.0 and over, adult: Secondary | ICD-10-CM | POA: Diagnosis not present

## 2019-05-21 DIAGNOSIS — E559 Vitamin D deficiency, unspecified: Secondary | ICD-10-CM | POA: Diagnosis not present

## 2019-05-21 DIAGNOSIS — F40243 Fear of flying: Secondary | ICD-10-CM | POA: Diagnosis not present

## 2019-05-21 DIAGNOSIS — Z418 Encounter for other procedures for purposes other than remedying health state: Secondary | ICD-10-CM | POA: Diagnosis not present

## 2019-05-21 DIAGNOSIS — Z124 Encounter for screening for malignant neoplasm of cervix: Secondary | ICD-10-CM | POA: Diagnosis not present

## 2019-06-24 ENCOUNTER — Other Ambulatory Visit (HOSPITAL_COMMUNITY)
Admission: RE | Admit: 2019-06-24 | Discharge: 2019-06-24 | Disposition: A | Discharge status: Home / Self Care | Payer: Medicaid Other | Source: Ambulatory Visit | Attending: Obstetrics & Gynecology | Admitting: Obstetrics & Gynecology

## 2019-06-24 ENCOUNTER — Encounter: Payer: Self-pay | Admitting: Obstetrics & Gynecology

## 2019-06-24 ENCOUNTER — Ambulatory Visit: Payer: Medicaid Other | Admitting: Obstetrics & Gynecology

## 2019-06-24 ENCOUNTER — Other Ambulatory Visit: Payer: Self-pay

## 2019-06-24 VITALS — BP 118/80 | HR 74 | Wt 256.0 lb

## 2019-06-24 DIAGNOSIS — Z0000 Encounter for general adult medical examination without abnormal findings: Secondary | ICD-10-CM | POA: Diagnosis not present

## 2019-06-24 DIAGNOSIS — Z90710 Acquired absence of both cervix and uterus: Secondary | ICD-10-CM | POA: Diagnosis not present

## 2019-06-24 DIAGNOSIS — B9689 Other specified bacterial agents as the cause of diseases classified elsewhere: Secondary | ICD-10-CM

## 2019-06-24 DIAGNOSIS — N760 Acute vaginitis: Secondary | ICD-10-CM | POA: Diagnosis not present

## 2019-06-24 DIAGNOSIS — Z01419 Encounter for gynecological examination (general) (routine) without abnormal findings: Secondary | ICD-10-CM

## 2019-06-24 NOTE — Patient Instructions (Signed)
Preventive Care 23-43 Years Old, Female Preventive care refers to visits with your health care provider and lifestyle choices that can promote health and wellness. This includes:  A yearly physical exam. This may also be called an annual well check.  Regular dental visits and eye exams.  Immunizations.  Screening for certain conditions.  Healthy lifestyle choices, such as eating a healthy diet, getting regular exercise, not using drugs or products that contain nicotine and tobacco, and limiting alcohol use.  YOU DO NOT NEED PAP SMEARS GIVEN YOUR HISTORY OF TOTAL HYSTERECTOMY! What can I expect for my preventive care visit? Physical exam Your health care provider will check your:  Height and weight. This may be used to calculate body mass index (BMI), which tells if you are at a healthy weight.  Heart rate and blood pressure.  Skin for abnormal spots. Counseling Your health care provider may ask you questions about your:  Alcohol, tobacco, and drug use.  Emotional well-being.  Home and relationship well-being.  Sexual activity.  Eating habits.  Work and work Statistician.  Method of birth control.  Menstrual cycle.  Pregnancy history. What immunizations do I need?  Influenza (flu) vaccine  This is recommended every year. Tetanus, diphtheria, and pertussis (Tdap) vaccine  You may need a Td booster every 10 years. Varicella (chickenpox) vaccine  You may need this if you have not been vaccinated. Zoster (shingles) vaccine  You may need this after age 35. Measles, mumps, and rubella (MMR) vaccine  You may need at least one dose of MMR if you were born in 1957 or later. You may also need a second dose. Pneumococcal conjugate (PCV13) vaccine  You may need this if you have certain conditions and were not previously vaccinated. Pneumococcal polysaccharide (PPSV23) vaccine  You may need one or two doses if you smoke cigarettes or if you have certain  conditions. Meningococcal conjugate (MenACWY) vaccine  You may need this if you have certain conditions. Hepatitis A vaccine  You may need this if you have certain conditions or if you travel or work in places where you may be exposed to hepatitis A. Hepatitis B vaccine  You may need this if you have certain conditions or if you travel or work in places where you may be exposed to hepatitis B. Haemophilus influenzae type b (Hib) vaccine  You may need this if you have certain conditions. Human papillomavirus (HPV) vaccine  If recommended by your health care provider, you may need three doses over 6 months. You may receive vaccines as individual doses or as more than one vaccine together in one shot (combination vaccines). Talk with your health care provider about the risks and benefits of combination vaccines. What tests do I need? Blood tests  Lipid and cholesterol levels. These may be checked every 5 years, or more frequently if you are over 55 years old.  Hepatitis C test.  Hepatitis B test. Screening  Lung cancer screening. You may have this screening every year starting at age 5 if you have a 30-pack-year history of smoking and currently smoke or have quit within the past 15 years.  Colorectal cancer screening. All adults should have this screening starting at age 22 and continuing until age 87. Your health care provider may recommend screening at age 56 if you are at increased risk. You will have tests every 1-10 years, depending on your results and the type of screening test.  Diabetes screening. This is done by checking your blood sugar (glucose)  after you have not eaten for a while (fasting). You may have this done every 1-3 years.  Mammogram. This may be done every 1-2 years. Talk with your health care provider about when you should start having regular mammograms. This may depend on whether you have a family history of breast cancer.  BRCA-related cancer screening. This  may be done if you have a family history of breast, ovarian, tubal, or peritoneal cancers.  Pelvic exam (WITHOUT PAP SMEAR). This may be done every 3 years OR AS NEEDED.  Other tests  Sexually transmitted disease (STD) testing.  Bone density scan. This is done to screen for osteoporosis. You may have this scan if you are at high risk for osteoporosis. Follow these instructions at home: Eating and drinking  Eat a diet that includes fresh fruits and vegetables, whole grains, lean protein, and low-fat dairy.  Take vitamin and mineral supplements as recommended by your health care provider.  Do not drink alcohol if: ? Your health care provider tells you not to drink. ? You are pregnant, may be pregnant, or are planning to become pregnant.  If you drink alcohol: ? Limit how much you have to 0-1 drink a day. ? Be aware of how much alcohol is in your drink. In the [place] one drink equals one 12 oz bottle of beer (355 mL), one 5 oz glass of wine (148 mL), or one 1 oz glass of hard liquor (44 mL). Lifestyle  Take daily care of your teeth and gums.  Stay active. Exercise for at least 30 minutes on 5 or more days each week.  Do not use any products that contain nicotine or tobacco, such as cigarettes, e-cigarettes, and chewing tobacco. If you need help quitting, ask your health care provider.  If you are sexually active, practice safe sex. Use a condom or other form of birth control (contraception) in order to prevent pregnancy and STIs (sexually transmitted infections).  If told by your health care provider, take low-dose aspirin daily starting at age 43. What's next?  Visit your health care provider once a year for a well check visit.  Ask your health care provider how often you should have your eyes and teeth checked.  Stay up to date on all vaccines. This information is not intended to replace advice given to you by your health care provider. Make sure you discuss any questions you  have with your health care provider. Document Revised: 09/28/2017 Document Reviewed: 09/28/2017 Elsevier Patient Education  2020 [employer].

## 2019-06-24 NOTE — Progress Notes (Signed)
GYNECOLOGY ANNUAL PREVENTATIVE CARE ENCOUNTER NOTE  History:     ZANETA LIGHTCAP is a 44 y.o. [id number] female with history of cesarean total hysterectomy for placenta accreta here for a routine annual gynecologic exam, referred from PCP at [facility].  Current complaints: none.  Traveling to [place] this week.   Denies abnormal vaginal bleeding, discharge, pelvic pain, problems with intercourse or other gynecologic concerns.    Gynecologic History Patient's last menstrual period was 01/29/2015. Contraception: status post hysterectomy  No history of high grade cervical dysplasia Last mammogram: 04/24/2019. Results were: normal  Obstetric History OB History  Gravida Para Term Preterm AB Living  8 5 5  0 3 4  SAB TAB Ectopic Multiple Live Births  2 1 0 0 5    # Outcome Date GA Lbr Len/2nd Weight Sex Delivery Anes PTL Lv  8 Term 2017     CS-Unspec        Birth Comments: Had Cesarean Hysterectomy     Complications: Placenta accreta  7 Term 10/29/14 [redacted]w[redacted]d  8 lb 6.6 oz (3.815 kg) M CS-LTranv Spinal, EPI  LIV  6 Term 04/07/11 [redacted]w[redacted]d  6 lb 12.6 oz (3.08 kg) F CS-LTranv Spinal  LIV     Birth Comments: no dysmorphic features; no void nor stool  5 SAB 04/2010          4 SAB 08/2009          3 Term 2007    F CS-Unspec   LIV  2 TAB 2005          1 Term 2000    F CS-Unspec   LIV    Past Medical History:  Diagnosis Date  . Anemia   . GERD (gastroesophageal reflux disease)   . Seasonal allergies     Past Surgical History:  Procedure Laterality Date  . 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 CESAREAN SECTION  2000,2007  . CESAREAN SECTION  04/07/2011   Procedure: CESAREAN SECTION;  Surgeon: Allyn Kenner, DO;  Location: [facility];  Service: Gynecology;  Laterality: N/A;  Repeat 04/14/11  . CESAREAN SECTION N/A 10/29/2014   Procedure: REPEAT CESAREAN SECTION;  Surgeon: Frederico Hamman, MD;  Location: [facility];  Service: Obstetrics;   Laterality: N/A;  . DILATION AND CURETTAGE OF UTERUS  04/2010   missed ab    Current Outpatient Medications on File Prior to Visit  Medication Sig Dispense Refill  . cephALEXin (KEFLEX) 500 MG capsule Take 1 capsule (500 mg total) by mouth 4 (four) times daily. (Patient not taking: Reported on 06/24/2019) 28 capsule 0   No current facility-administered medications on file prior to visit.    No Known Allergies  Social History:  reports that she has never smoked. She has never used smokeless tobacco. She reports that she does not drink alcohol or use drugs.  Family History  Problem Relation Age of Onset  . Hypertension Mother   . Diabetes Father   . Hypertension Father     The following portions of the patient's history were reviewed and updated as appropriate: allergies, current medications, past family history, past medical history, past social history, past surgical history and problem list.  Review of Systems Pertinent items noted in HPI and remainder of comprehensive ROS otherwise negative.  Physical Exam:  BP 118/80   Pulse 74   Wt 256 lb (116.1 kg)   LMP 01/29/2015 Comment: had baby in Sept, had  period in Nov and Dec, none since  BMI 41.32 kg/m  CONSTITUTIONAL: Well-developed, well-nourished female in no acute distress.  HENT:  Normocephalic, atraumatic, External right and left ear normal. Oropharynx is clear and moist EYES: Conjunctivae and EOM are normal. Pupils are equal, round, and reactive to light. No scleral icterus.  NECK: Normal range of motion, supple, no masses.  Normal thyroid.  SKIN: Skin is warm and dry. No rash noted. Not diaphoretic. No erythema. No pallor. MUSCULOSKELETAL: Normal range of motion. No tenderness.  No cyanosis, clubbing, or edema.  2+ distal pulses. NEUROLOGIC: Alert and oriented to person, place, and time. Normal reflexes, muscle tone coordination.  PSYCHIATRIC: Normal mood and affect. Normal behavior. Normal judgment and thought  content. CARDIOVASCULAR: Normal heart rate noted, regular rhythm RESPIRATORY: Clear to auscultation bilaterally. Effort and breath sounds normal, no problems with respiration noted. BREASTS: Symmetric in size. No masses, tenderness, skin changes, nipple drainage, or lymphadenopathy bilaterally. Performed in the presence of a chaperone. ABDOMEN: Soft, no distention noted.  No tenderness, rebound or guarding.  PELVIC: Normal appearing external genitalia and urethral meatus; normal appearing vaginal mucosa and well-healed vaginal cuff.  White discharge noted, testing sample obtained.  Performed in the presence of a chaperone.   Assessment and Plan:      1. Well woman exam with routine gynecological exam 2. S/P cesarean total hysterectomy for placenta accreta Reviewed operative note and [facility] reports [facility]. No history of high grade cervical dysplasia No further pap smear screening indicated. She desired annual STI testing, this was ordered. Will follow up results and manage accordingly. - Cervicovaginal ancillary only( [place]) - Hepatitis B surface antigen - Hepatitis C antibody - RPR - HIV Antibody (routine testing w rflx) Routine preventative health maintenance measures emphasized. Follow up with PCP for other medical issues and concerns. Please refer to After Visit Summary for other counseling recommendations.      Jaynie Collins, MD, FACOG Obstetrician & Gynecologist, [facility] for [employer], [facility]

## 2019-06-25 LAB — CERVICOVAGINAL ANCILLARY ONLY
Bacterial Vaginitis (gardnerella): POSITIVE — AB
Candida Glabrata: NEGATIVE
Candida Vaginitis: NEGATIVE
Chlamydia: NEGATIVE
Comment: NEGATIVE
Comment: NEGATIVE
Comment: NEGATIVE
Comment: NEGATIVE
Comment: NEGATIVE
Comment: NORMAL
Neisseria Gonorrhea: NEGATIVE
Trichomonas: NEGATIVE

## 2019-06-25 LAB — HEPATITIS C ANTIBODY: Hep C Virus Ab: <0.1 s/co ratio (ref 0.0–0.9)

## 2019-06-25 LAB — HIV ANTIBODY (ROUTINE TESTING W REFLEX): HIV Screen 4th Generation wRfx: NONREACTIVE

## 2019-06-25 LAB — HEPATITIS B SURFACE ANTIGEN: Hepatitis B Surface Ag: NEGATIVE

## 2019-06-25 LAB — RPR: RPR Ser Ql: NONREACTIVE

## 2019-06-25 MED ORDER — METRONIDAZOLE 500 MG PO TABS: 500.0000 mg | ORAL_TABLET | Freq: Two times a day (BID) | ORAL | 0 refills | Status: DC

## 2019-06-25 NOTE — Addendum Note (Signed)
Addended by: Jaynie Collins A on: 06/25/2019 12:39 PM   Modules accepted: Orders

## 2019-06-26 ENCOUNTER — Ambulatory Visit (HOSPITAL_COMMUNITY)
Admission: EM | Admit: 2019-06-26 | Discharge: 2019-06-26 | Disposition: A | Discharge status: Home / Self Care | Payer: Medicaid Other | Attending: Urgent Care | Admitting: Urgent Care

## 2019-06-26 ENCOUNTER — Encounter (HOSPITAL_COMMUNITY): Payer: Self-pay

## 2019-06-26 DIAGNOSIS — Z1152 Encounter for screening for COVID-19: Secondary | ICD-10-CM | POA: Insufficient documentation

## 2019-06-26 NOTE — ED Provider Notes (Signed)
[facility]   MRN: [id number] DOB: 08-31-1975  Subjective:   Rachel Wu is a 44 y.o. female presenting for COVID-19 testing for traveling.  Patient has do this within 72 hours of leaving on Friday. COVID vaccine completed 05/31/2019.   No current facility-administered medications for this encounter.  Current Outpatient Medications:  .  cephALEXin (KEFLEX) 500 MG capsule, Take 1 capsule (500 mg total) by mouth 4 (four) times daily. (Patient not taking: Reported on 06/24/2019), Disp: 28 capsule, Rfl: 0 .  mefloquine (LARIAM) 250 MG tablet, 250 mg., Disp: , Rfl:  .  metroNIDAZOLE (FLAGYL) 500 MG tablet, Take 1 tablet (500 mg total) by mouth 2 (two) times daily., Disp: 14 tablet, Rfl: 0   No Known Allergies  Past Medical History:  Diagnosis Date  . Anemia   . GERD (gastroesophageal reflux disease)   . Seasonal allergies      Past Surgical History:  Procedure Laterality Date  . 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 CESAREAN SECTION  2000,2007  . CESAREAN SECTION  04/07/2011   Procedure: CESAREAN SECTION;  Surgeon: Philip Aspen, DO;  Location: [facility];  Service: Gynecology;  Laterality: N/A;  Repeat 04/14/11  . CESAREAN SECTION N/A 10/29/2014   Procedure: REPEAT CESAREAN SECTION;  Surgeon: Kathreen Cosier, MD;  Location: [facility];  Service: Obstetrics;  Laterality: N/A;  . DILATION AND CURETTAGE OF UTERUS  04/2010   missed ab    Family History  Problem Relation Age of Onset  . Hypertension Mother   . Diabetes Father   . Hypertension Father     Social History   Tobacco Use  . Smoking status: Never Smoker  . Smokeless tobacco: Never Used  Substance Use Topics  . Alcohol use: No  . Drug use: No    Review of Systems  Constitutional: Negative for fever and malaise/fatigue.  HENT: Negative for congestion, ear pain, sinus pain and sore throat.   Eyes: Negative for blurred vision, double vision, discharge  and redness.  Respiratory: Negative for cough, hemoptysis, shortness of breath and wheezing.   Cardiovascular: Negative for chest pain.  Gastrointestinal: Negative for abdominal pain, diarrhea, nausea and vomiting.  Genitourinary: Negative for dysuria, flank pain and hematuria.  Musculoskeletal: Negative for myalgias.  Skin: Negative for rash.  Neurological: Negative for dizziness, weakness and headaches.  Psychiatric/Behavioral: Negative for depression and substance abuse.     Objective:   Vitals: BP 118/67   Pulse 71   Temp 98.7 F (37.1 C) (Oral)   Resp 18   Ht 5\' 5"  (1.651 m)   Wt 256 lb (116.1 kg)   LMP 01/29/2015 Comment: had baby in Sept, had period in Nov and Dec, none since  SpO2 99%   BMI 42.60 kg/m   Physical Exam Constitutional:      General: She is not in acute distress.    Appearance: Normal appearance. She is well-developed. She is not ill-appearing, toxic-appearing or diaphoretic.  HENT:     Head: Normocephalic and atraumatic.     Nose: Nose normal.     Mouth/Throat:     Mouth: Mucous membranes are moist.     Pharynx: Oropharynx is clear.  Eyes:     General: No scleral icterus.    Extraocular Movements: Extraocular movements intact.     Pupils: Pupils are equal, round, and reactive to light.  Cardiovascular:     Rate and Rhythm: Normal rate.  Pulmonary:  Effort: Pulmonary effort is normal.  Skin:    General: Skin is warm and dry.  Neurological:     General: No focal deficit present.     Mental Status: She is alert and oriented to person, place, and time.  Psychiatric:        Mood and Affect: Mood normal.        Behavior: Behavior normal.        Thought Content: Thought content normal.        Judgment: Judgment normal.      Assessment and Plan :   PDMP not reviewed this encounter.  1. Encounter for screening for COVID-19     Counseled patient on nature of COVID-19 including modes of transmission, diagnostic testing, management and  supportive care.  Counseled on medications used for symptomatic relief. COVID 19 testing is pending. Counseled patient on potential for adverse effects with medications prescribed/recommended today, ER and return-to-clinic precautions discussed, patient verbalized understanding.    Jaynee Eagles, [place] 06/26/19 [phone]

## 2019-06-26 NOTE — Discharge Instructions (Signed)

## 2019-06-26 NOTE — ED Triage Notes (Signed)
Pt wants a COVID test for travel. PT denies symptoms

## 2019-06-27 LAB — SARS CORONAVIRUS 2 (TAT 6-24 HRS): SARS Coronavirus 2: NEGATIVE

## 2019-08-01 DIAGNOSIS — Z419 Encounter for procedure for purposes other than remedying health state, unspecified: Secondary | ICD-10-CM | POA: Diagnosis not present

## 2019-09-01 DIAGNOSIS — Z419 Encounter for procedure for purposes other than remedying health state, unspecified: Secondary | ICD-10-CM | POA: Diagnosis not present

## 2019-09-13 DIAGNOSIS — Z20828 Contact with and (suspected) exposure to other viral communicable diseases: Secondary | ICD-10-CM | POA: Diagnosis not present

## 2019-10-02 DIAGNOSIS — Z419 Encounter for procedure for purposes other than remedying health state, unspecified: Secondary | ICD-10-CM | POA: Diagnosis not present

## 2019-11-01 DIAGNOSIS — Z419 Encounter for procedure for purposes other than remedying health state, unspecified: Secondary | ICD-10-CM | POA: Diagnosis not present

## 2019-11-22 DIAGNOSIS — Z03818 Encounter for observation for suspected exposure to other biological agents ruled out: Secondary | ICD-10-CM | POA: Diagnosis not present

## 2019-12-02 DIAGNOSIS — Z419 Encounter for procedure for purposes other than remedying health state, unspecified: Secondary | ICD-10-CM | POA: Diagnosis not present

## 2020-01-01 DIAGNOSIS — Z419 Encounter for procedure for purposes other than remedying health state, unspecified: Secondary | ICD-10-CM | POA: Diagnosis not present

## 2020-02-01 DIAGNOSIS — Z419 Encounter for procedure for purposes other than remedying health state, unspecified: Secondary | ICD-10-CM | POA: Diagnosis not present

## 2020-03-03 DIAGNOSIS — Z419 Encounter for procedure for purposes other than remedying health state, unspecified: Secondary | ICD-10-CM | POA: Diagnosis not present

## 2020-03-31 DIAGNOSIS — Z419 Encounter for procedure for purposes other than remedying health state, unspecified: Secondary | ICD-10-CM | POA: Diagnosis not present

## 2020-04-28 ENCOUNTER — Ambulatory Visit (HOSPITAL_COMMUNITY)
Admission: EM | Admit: 2020-04-28 | Discharge: 2020-04-28 | Disposition: A | Discharge status: Home / Self Care | Payer: Medicaid Other | Attending: Internal Medicine | Admitting: Internal Medicine

## 2020-04-28 ENCOUNTER — Other Ambulatory Visit: Payer: Self-pay

## 2020-04-28 DIAGNOSIS — Z0189 Encounter for other specified special examinations: Secondary | ICD-10-CM | POA: Diagnosis not present

## 2020-04-28 DIAGNOSIS — Z20822 Contact with and (suspected) exposure to covid-19: Secondary | ICD-10-CM | POA: Diagnosis not present

## 2020-04-28 LAB — SARS CORONAVIRUS 2 (TAT 6-24 HRS): SARS Coronavirus 2: NEGATIVE

## 2020-04-28 NOTE — ED Triage Notes (Signed)
patient is requesting covid test for traveling

## 2020-05-01 DIAGNOSIS — Z419 Encounter for procedure for purposes other than remedying health state, unspecified: Secondary | ICD-10-CM | POA: Diagnosis not present

## 2020-05-31 DIAGNOSIS — Z419 Encounter for procedure for purposes other than remedying health state, unspecified: Secondary | ICD-10-CM | POA: Diagnosis not present

## 2020-07-01 DIAGNOSIS — Z419 Encounter for procedure for purposes other than remedying health state, unspecified: Secondary | ICD-10-CM | POA: Diagnosis not present

## 2020-07-06 DIAGNOSIS — Z6841 Body Mass Index (BMI) 40.0 and over, adult: Secondary | ICD-10-CM | POA: Diagnosis not present

## 2020-07-06 DIAGNOSIS — Z13220 Encounter for screening for lipoid disorders: Secondary | ICD-10-CM | POA: Diagnosis not present

## 2020-07-06 DIAGNOSIS — M5459 Other low back pain: Secondary | ICD-10-CM | POA: Diagnosis not present

## 2020-07-06 DIAGNOSIS — Z131 Encounter for screening for diabetes mellitus: Secondary | ICD-10-CM | POA: Diagnosis not present

## 2020-07-06 DIAGNOSIS — J302 Other seasonal allergic rhinitis: Secondary | ICD-10-CM | POA: Diagnosis not present

## 2020-07-06 DIAGNOSIS — Z0000 Encounter for general adult medical examination without abnormal findings: Secondary | ICD-10-CM | POA: Diagnosis not present

## 2020-07-06 DIAGNOSIS — Z1239 Encounter for other screening for malignant neoplasm of breast: Secondary | ICD-10-CM | POA: Diagnosis not present

## 2020-07-31 DIAGNOSIS — Z419 Encounter for procedure for purposes other than remedying health state, unspecified: Secondary | ICD-10-CM | POA: Diagnosis not present

## 2021-04-06 ENCOUNTER — Other Ambulatory Visit: Payer: Self-pay | Admitting: Internal Medicine

## 2021-04-06 DIAGNOSIS — Z6841 Body Mass Index (BMI) 40.0 and over, adult: Secondary | ICD-10-CM | POA: Diagnosis not present

## 2021-04-06 DIAGNOSIS — Z418 Encounter for other procedures for purposes other than remedying health state: Secondary | ICD-10-CM | POA: Diagnosis not present

## 2021-04-06 DIAGNOSIS — Z131 Encounter for screening for diabetes mellitus: Secondary | ICD-10-CM | POA: Diagnosis not present

## 2021-04-06 DIAGNOSIS — M545 Low back pain, unspecified: Secondary | ICD-10-CM | POA: Diagnosis not present

## 2021-04-06 DIAGNOSIS — Z13220 Encounter for screening for lipoid disorders: Secondary | ICD-10-CM | POA: Diagnosis not present

## 2021-04-06 DIAGNOSIS — Z0000 Encounter for general adult medical examination without abnormal findings: Secondary | ICD-10-CM | POA: Diagnosis not present

## 2021-04-06 DIAGNOSIS — L26 Exfoliative dermatitis: Secondary | ICD-10-CM | POA: Diagnosis not present

## 2021-04-06 DIAGNOSIS — E559 Vitamin D deficiency, unspecified: Secondary | ICD-10-CM | POA: Diagnosis not present

## 2021-04-07 LAB — CBC
HCT: 35.9 % (ref 35.0–45.0)
Hemoglobin: 11.8 g/dL (ref 11.7–15.5)
MCH: 28.4 pg (ref 27.0–33.0)
MCHC: 32.9 g/dL (ref 32.0–36.0)
MCV: 86.3 fL (ref 80.0–100.0)
MPV: 12 fL (ref 7.5–12.5)
Platelets: 211 10*3/uL (ref 140–400)
RBC: 4.16 10*6/uL (ref 3.80–5.10)
RDW: 13 % (ref 11.0–15.0)
WBC: 6.9 10*3/uL (ref 3.8–10.8)

## 2021-04-07 LAB — COMPLETE METABOLIC PANEL WITH GFR
AG Ratio: 1.2 (calc) (ref 1.0–2.5)
ALT: 14 U/L (ref 6–29)
AST: 17 U/L (ref 10–30)
Albumin: 4 g/dL (ref 3.6–5.1)
Alkaline phosphatase (APISO): 67 U/L (ref 31–125)
BUN: 13 mg/dL (ref 7–25)
CO2: 26 mmol/L (ref 20–32)
Calcium: 9.3 mg/dL (ref 8.6–10.2)
Chloride: 100 mmol/L (ref 98–110)
Creat: 0.91 mg/dL (ref 0.50–0.99)
Globulin: 3.4 g/dL (calc) (ref 1.9–3.7)
Glucose, Bld: 80 mg/dL (ref 65–99)
Potassium: 4.1 mmol/L (ref 3.5–5.3)
Sodium: 136 mmol/L (ref 135–146)
Total Bilirubin: 0.5 mg/dL (ref 0.2–1.2)
Total Protein: 7.4 g/dL (ref 6.1–8.1)
eGFR: 80 mL/min/{1.73_m2} (ref >=60)

## 2021-04-07 LAB — LIPID PANEL
Cholesterol: 225 mg/dL — ABNORMAL HIGH (ref <200)
HDL: 47 mg/dL — ABNORMAL LOW (ref >=50)
LDL Cholesterol (Calc): 162 mg/dL (calc) — ABNORMAL HIGH
Non-HDL Cholesterol (Calc): 178 mg/dL (calc) — ABNORMAL HIGH (ref <130)
Total CHOL/HDL Ratio: 4.8 (calc) (ref <5.0)
Triglycerides: 64 mg/dL (ref <150)

## 2021-04-07 LAB — VITAMIN D 25 HYDROXY (VIT D DEFICIENCY, FRACTURES): Vit D, 25-Hydroxy: 23 ng/mL — ABNORMAL LOW (ref 30–100)

## 2021-04-07 LAB — TSH: TSH: 2.99 mIU/L

## 2021-10-18 ENCOUNTER — Encounter (HOSPITAL_COMMUNITY): Payer: Self-pay | Admitting: Emergency Medicine

## 2021-10-18 ENCOUNTER — Other Ambulatory Visit: Payer: Self-pay

## 2021-10-18 ENCOUNTER — Ambulatory Visit (HOSPITAL_COMMUNITY)
Admission: EM | Admit: 2021-10-18 | Discharge: 2021-10-18 | Disposition: A | Discharge status: Home / Self Care | Payer: Medicaid Other | Attending: Emergency Medicine | Admitting: Emergency Medicine

## 2021-10-18 DIAGNOSIS — G44209 Tension-type headache, unspecified, not intractable: Secondary | ICD-10-CM

## 2021-10-18 DIAGNOSIS — G43909 Migraine, unspecified, not intractable, without status migrainosus: Secondary | ICD-10-CM | POA: Diagnosis not present

## 2021-10-18 MED ORDER — KETOROLAC TROMETHAMINE 30 MG/ML IJ SOLN
30.0000 mg | Freq: Once | INTRAMUSCULAR | Status: AC
  Administered 2021-10-18: 30 mg via INTRAMUSCULAR

## 2021-10-18 MED ORDER — KETOROLAC TROMETHAMINE 30 MG/ML IJ SOLN
INTRAMUSCULAR | Status: AC
  Filled 2021-10-18: qty 1

## 2021-10-18 MED ORDER — SUMATRIPTAN SUCCINATE 6 MG/0.5ML ~~LOC~~ SOLN
6.0000 mg | Freq: Once | SUBCUTANEOUS | Status: AC
  Administered 2021-10-18: 6 mg via SUBCUTANEOUS

## 2021-10-18 MED ORDER — SUMATRIPTAN SUCCINATE 6 MG/0.5ML ~~LOC~~ SOLN
SUBCUTANEOUS | Status: AC
  Filled 2021-10-18: qty 0.5

## 2021-10-18 NOTE — ED Provider Notes (Signed)
[facility]    CSN: [id number] Arrival date & time: 10/18/21  3903    History   Chief Complaint Chief Complaint  Patient presents with   Headache   Generalized Body Aches    HPI Rachel Wu is a 45 y.o. female.  Presents with headache and joint pain that began yesterday Reports 10/10 frontal headache, feels pounding sensation.  Feels all her joints ache but denies muscle aches Some nausea this morning No light or sound sensitivity, denies vision changes  No fever. No neck pain or stiffness, no back pain Denies URI symptoms or recent illness, no sick contacts No head injury or trauma Denies rash  No history of migraines. 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)   CESAREAN SECTION  2000,2007   CESAREAN SECTION  04/07/2011   Procedure: CESAREAN SECTION;  Surgeon: Philip Aspen, DO;  Location: [facility];  Service: Gynecology;  Laterality: N/A;  Repeat 04/14/11   CESAREAN SECTION N/A 10/29/2014   Procedure: REPEAT CESAREAN SECTION;  Surgeon: Kathreen Cosier, MD;  Location: [facility];  Service: Obstetrics;  Laterality: N/A;   DILATION AND CURETTAGE OF UTERUS  04/2010   missed ab    OB History     Gravida  8   Para  5   Term  5   Preterm  0   AB  3   Living  4      SAB  2   IAB  1   Ectopic  0   Multiple  0   Live Births  5            Home Medications    Prior to Admission medications   Medication Sig Start Date End Date Taking? Authorizing Provider  mefloquine (LARIAM) 250 MG tablet 250 mg. 05/21/19   [provider]    Family History Family History   Problem Relation Age of Onset   Hypertension Mother    Diabetes Father    Hypertension Father     Social History Social History   Tobacco Use   Smoking status: Never   Smokeless tobacco: Never  Vaping Use   Vaping Use: Never used  Substance Use Topics   Alcohol use: No   Drug use: No     Allergies   Patient has no known allergies.   Review of Systems Review of Systems  Neurological:  Positive for headaches.   Per HPI  Physical Exam Triage Vital Signs ED Triage Vitals  Enc Vitals Group     BP 10/18/21 0824 139/83     Pulse Rate 10/18/21 0824 (!) 106     Resp 10/18/21 0824 20     Temp 10/18/21 0824 98.2 F (36.8 C)     Temp Source 10/18/21 0824 Oral     SpO2 10/18/21 0824 100 %     Weight --      Height --      Head Circumference --      Peak Flow --      Pain Score 10/18/21 0823 10     Pain Loc --  Pain Edu? --      Excl. in GC? --    No data found.  Updated Vital Signs BP 139/83 (BP Location: Left Wrist)   Pulse (!) 106   Temp 98.2 F (36.8 C) (Oral)   Resp 20   LMP 01/29/2015 Comment: had baby in Sept, had period in Nov and Dec, none since  SpO2 100%   Physical Exam Vitals and nursing note reviewed.  Constitutional:      General: She is not in acute distress. HENT:     Head: Normocephalic and atraumatic.     Nose: Nose normal.     Mouth/Throat:     Mouth: Mucous membranes are moist.     Pharynx: Oropharynx is clear. No posterior oropharyngeal erythema.  Eyes:     Extraocular Movements: Extraocular movements intact.     Conjunctiva/sclera: Conjunctivae normal.     Pupils: Pupils are equal, round, and reactive to light.  Cardiovascular:     Rate and Rhythm: Normal rate and regular rhythm.     Pulses: Normal pulses.     Heart sounds: Normal heart sounds.  Pulmonary:     Effort: Pulmonary effort is normal.     Breath sounds: Normal breath sounds.  Abdominal:     Palpations: Abdomen is soft.     Tenderness: There is no abdominal  tenderness.  Musculoskeletal:        General: No swelling, tenderness or deformity. Normal range of motion.     Cervical back: Normal range of motion. No rigidity or tenderness.  Skin:    Findings: No rash.  Neurological:     General: No focal deficit present.     Mental Status: She is alert and oriented to person, place, and time.     Cranial Nerves: Cranial nerves 2-12 are intact. No cranial nerve deficit or facial asymmetry.     Sensory: Sensation is intact. No sensory deficit.     Motor: Motor function is intact. No weakness or tremor.     Coordination: Coordination is intact. Coordination normal.     Gait: Gait is intact. Gait normal.  Psychiatric:        Mood and Affect: Affect is tearful.      UC Treatments / Results  Labs (all labs ordered are listed, but only abnormal results are displayed) Labs Reviewed - No data to display  EKG  Radiology No results found.  Procedures Procedures  Medications Ordered in UC Medications  ketorolac (TORADOL) 30 MG/ML injection 30 mg (30 mg Intramuscular Given 10/18/21 0845)  SUMAtriptan (IMITREX) injection 6 mg (6 mg Subcutaneous Given 10/18/21 0931)    Initial Impression / Assessment and Plan / UC Course  I have reviewed the triage vital signs and the nursing notes.  Pertinent labs & imaging results that were available during my care of the patient were reviewed by me and considered in my medical decision making (see chart for details).  Migraine vs tension headache  Normal neuro exam. Vitals are stable. Physical exam unremarkable but patient tearful  Toradol injection given, reports joint pain resolved but headache persists.  IM imitrex given, headache down to 7/10 and feels more like "tension" per patient Neuro exam unchanged, and with improving pain head imaging not warranted at this time.   Recommend close follow up with PCP. May need to alternate tylenol/ibuprofen for the next few days. Discussed increase water, avoid  triggers like caffeine, screens. Provided work note. Discussed strict ED precautions. Patient verbalizes understanding  Final Clinical  Impressions(s) / UC Diagnoses   Final diagnoses:  Acute non intractable tension-type headache  Migraine without status migrainosus, not intractable, unspecified migraine type     Discharge Instructions      Please follow up with your primary care provider regarding your symptoms.  Go to the emergency department if symptoms worsen or do not respond to medication.  I recommend avoiding bright screens and triggers such as alcohol, coffee, stress.  If needed you can try tylenol alternated with ibuprofen every 6 hours. Increase your fluid/water intake as much as tolerated.     ED Prescriptions   None    PDMP not reviewed this encounter.   Mattisyn Cardona, Wells Guiles, PA-C 10/18/21 1004

## 2021-10-18 NOTE — Discharge Instructions (Addendum)
Please follow up with your primary care provider regarding your symptoms.  Go to the emergency department if symptoms worsen or do not respond to medication.  I recommend avoiding bright screens and triggers such as alcohol, coffee, stress.  If needed you can try tylenol alternated with ibuprofen every 6 hours. Increase your fluid/water intake as much as tolerated.

## 2021-10-18 NOTE — ED Triage Notes (Signed)
Patient presents to [facility] for evaluation of 2 days of joint pain, skin burning, and "migraine".  Denies hx of migraines, states she tried 800mg  ibuprofen at home without relief.  Tearful in room due to the discomfort

## 2022-02-10 ENCOUNTER — Other Ambulatory Visit: Payer: Self-pay | Admitting: Internal Medicine

## 2022-02-10 DIAGNOSIS — Z0000 Encounter for general adult medical examination without abnormal findings: Secondary | ICD-10-CM

## 2022-02-15 ENCOUNTER — Ambulatory Visit
Admission: RE | Admit: 2022-02-15 | Discharge: 2022-02-15 | Disposition: A | Discharge status: Home / Self Care | Payer: Medicaid Other | Source: Ambulatory Visit | Attending: Internal Medicine | Admitting: Internal Medicine

## 2022-02-15 DIAGNOSIS — Z0000 Encounter for general adult medical examination without abnormal findings: Secondary | ICD-10-CM

## 2022-06-16 ENCOUNTER — Other Ambulatory Visit: Payer: Self-pay | Admitting: Internal Medicine

## 2022-06-17 LAB — COMPLETE METABOLIC PANEL WITH GFR
AG Ratio: 1.3 (calc) (ref 1.0–2.5)
ALT: 15 U/L (ref 6–29)
AST: 17 U/L (ref 10–35)
Albumin: 3.9 g/dL (ref 3.6–5.1)
Alkaline phosphatase (APISO): 75 U/L (ref 31–125)
BUN: 12 mg/dL (ref 7–25)
CO2: 25 mmol/L (ref 20–32)
Calcium: 8.9 mg/dL (ref 8.6–10.2)
Chloride: 105 mmol/L (ref 98–110)
Creat: 0.79 mg/dL (ref 0.50–0.99)
Globulin: 3 g/dL (calc) (ref 1.9–3.7)
Glucose, Bld: 92 mg/dL (ref 65–99)
Potassium: 3.9 mmol/L (ref 3.5–5.3)
Sodium: 139 mmol/L (ref 135–146)
Total Bilirubin: 0.5 mg/dL (ref 0.2–1.2)
Total Protein: 6.9 g/dL (ref 6.1–8.1)
eGFR: 93 mL/min/{1.73_m2} (ref >=60)

## 2022-06-17 LAB — CBC
HCT: 35.8 % (ref 35.0–45.0)
Hemoglobin: 11.7 g/dL (ref 11.7–15.5)
MCH: 28.2 pg (ref 27.0–33.0)
MCHC: 32.7 g/dL (ref 32.0–36.0)
MCV: 86.3 fL (ref 80.0–100.0)
MPV: 11.6 fL (ref 7.5–12.5)
Platelets: 205 10*3/uL (ref 140–400)
RBC: 4.15 10*6/uL (ref 3.80–5.10)
RDW: 12.8 % (ref 11.0–15.0)
WBC: 6.2 10*3/uL (ref 3.8–10.8)

## 2022-06-17 LAB — LIPID PANEL
Cholesterol: 207 mg/dL — ABNORMAL HIGH (ref <200)
HDL: 50 mg/dL (ref >=50)
LDL Cholesterol (Calc): 143 mg/dL (calc) — ABNORMAL HIGH
Non-HDL Cholesterol (Calc): 157 mg/dL (calc) — ABNORMAL HIGH (ref <130)
Total CHOL/HDL Ratio: 4.1 (calc) (ref <5.0)
Triglycerides: 53 mg/dL (ref <150)

## 2022-06-17 LAB — URINE CULTURE

## 2022-06-17 LAB — VITAMIN B12: Vitamin B-12: 530 pg/mL (ref 200–1100)

## 2022-06-17 LAB — TSH: TSH: 3 mIU/L

## 2022-06-17 LAB — FOLATE: Folate: 3.9 ng/mL — ABNORMAL LOW

## 2022-06-17 LAB — VITAMIN D 25 HYDROXY (VIT D DEFICIENCY, FRACTURES): Vit D, 25-Hydroxy: 27 ng/mL — ABNORMAL LOW (ref 30–100)

## 2023-05-02 ENCOUNTER — Encounter (HOSPITAL_COMMUNITY): Payer: Self-pay

## 2023-05-02 ENCOUNTER — Ambulatory Visit (HOSPITAL_COMMUNITY)
Admission: EM | Admit: 2023-05-02 | Discharge: 2023-05-02 | Disposition: A | Discharge status: Home / Self Care | Attending: Physician Assistant | Admitting: Physician Assistant

## 2023-05-02 DIAGNOSIS — M5431 Sciatica, right side: Secondary | ICD-10-CM | POA: Diagnosis not present

## 2023-05-02 DIAGNOSIS — G44209 Tension-type headache, unspecified, not intractable: Secondary | ICD-10-CM

## 2023-05-02 LAB — POCT INFLUENZA A/B
Influenza A, POC: NEGATIVE
Influenza B, POC: NEGATIVE

## 2023-05-02 MED ORDER — KETOROLAC TROMETHAMINE 30 MG/ML IJ SOLN
INTRAMUSCULAR | Status: AC
  Filled 2023-05-02: qty 1

## 2023-05-02 MED ORDER — MELOXICAM 7.5 MG PO TABS: 7.5000 mg | ORAL_TABLET | Freq: Every day | ORAL | 0 refills | Status: AC

## 2023-05-02 MED ORDER — KETOROLAC TROMETHAMINE 30 MG/ML IJ SOLN
30.0000 mg | Freq: Once | INTRAMUSCULAR | Status: AC
  Administered 2023-05-02: 30 mg via INTRAMUSCULAR

## 2023-05-02 NOTE — ED Provider Notes (Signed)
 [facility]    CSN: [id number] Arrival date & time: 05/02/23  3086      History   Chief Complaint Chief Complaint  Patient presents with   Migraine   Leg Pain    HPI Rachel Wu is a 48 y.o. female.   HPI   She reports she is having a "migraine"  She states her legs hurt as well - reports this radiates from her back down to her legs She states she was seen for this previously in UC and was given 2 shots and this seemed to calm it down She reports pain in her legs and back started about 2 days ago and last night it was so bad that she couldn't sleep Pain level 10/10 and she reports she cannot bear it  She denies injury, fall or suspicion for acute fracture She states the right leg is hurting the worst  Interventions: Excedrin migraine, motrin   Headache She states it wraps around from the back of her head to the forehead Pain level: "its like off and on". She denies current headache pain  She denies vision changes, current photophobia or phonophobia She reports increased fatigue, nausea with headache    Past Medical History:  Diagnosis Date   Anemia    GERD (gastroesophageal reflux disease)    Seasonal allergies     There are no active problems to display for this patient.   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)   CESAREAN SECTION  2000,2007   CESAREAN SECTION  04/07/2011   Procedure: CESAREAN SECTION;  Surgeon: Philip Aspen, DO;  Location: [facility];  Service: Gynecology;  Laterality: N/A;  Repeat 04/14/11   CESAREAN SECTION N/A 10/29/2014   Procedure: REPEAT CESAREAN SECTION;  Surgeon: Kathreen Cosier, MD;  Location: [facility];  Service: Obstetrics;  Laterality: N/A;   DILATION AND CURETTAGE OF UTERUS  04/2010   missed ab    OB History     Gravida  8   Para  5   Term  5   Preterm  0   AB  3   Living  4      SAB  2   IAB   1   Ectopic  0   Multiple  0   Live Births  5            Home Medications    Prior to Admission medications   Medication Sig Start Date End Date Taking? Authorizing Provider  meloxicam (MOBIC) 7.5 MG tablet Take 1 tablet (7.5 mg total) by mouth daily. 05/02/23  Yes Keene Gilkey, Oswaldo Conroy, PA-C    Family History Family History  Problem Relation Age of Onset   Hypertension Mother    Diabetes Father    Hypertension Father     Social History Social History   Tobacco Use   Smoking status: Never   Smokeless tobacco: Never  Vaping Use   Vaping status: Never Used  Substance Use Topics   Alcohol use: No   Drug use: No     Allergies   Patient has no known allergies.   Review of Systems Review of Systems  Constitutional:  Positive for chills and fatigue. Negative for fever.  Eyes:  Positive for photophobia. Negative for visual disturbance.  Gastrointestinal:  Positive for nausea.  Musculoskeletal:  Positive for myalgias.  Neurological:  Positive for light-headedness and headaches. Negative for  dizziness.     Physical Exam Triage Vital Signs ED Triage Vitals  Encounter Vitals Group     BP 05/02/23 0823 106/70     Systolic BP Percentile --      Diastolic BP Percentile --      Pulse Rate 05/02/23 0823 75     Resp 05/02/23 0823 18     Temp 05/02/23 0823 98.4 F (36.9 C)     Temp Source 05/02/23 0823 Oral     SpO2 05/02/23 0823 97 %     Weight --      Height --      Head Circumference --      Peak Flow --      Pain Score 05/02/23 0824 10     Pain Loc --      Pain Education --      Exclude from Growth Chart --    No data found.  Updated Vital Signs BP 106/70 (BP Location: Left Arm)   Pulse 75   Temp 98.4 F (36.9 C) (Oral)   Resp 18   LMP 01/29/2015 Comment: had baby in Sept, had period in Nov and Dec, none since  SpO2 97%   Visual Acuity Right Eye Distance:   Left Eye Distance:   Bilateral Distance:    Right Eye Near:   Left Eye Near:     Bilateral Near:     Physical Exam Vitals reviewed.  Constitutional:      General: She is awake. She is not in acute distress.    Appearance: Normal appearance. She is well-developed and well-groomed. She is not ill-appearing or toxic-appearing.     Comments: Patient is sitting in exam chair comfortably in not obvious signs of acute distress.   HENT:     Head: Normocephalic and atraumatic.  Pulmonary:     Effort: Pulmonary effort is normal.  Musculoskeletal:     Cervical back: Normal.     Thoracic back: Normal. Normal range of motion.     Lumbar back: Tenderness present. Decreased range of motion. Positive right straight leg raise test.     Comments: ROM findings Thoracic: Lateral flexion and lateral rotation are intact and symmetrical.  She reports pain with lateral rotation and lateral flexion to the left Lumbar: Extension, flexion are intact.  She reports pain on the right side with extension. Hips: Flexion, extension, abduction are limited on the right side compared to the left. Hip flexion strength is 5/5 Dorsiflexion and plantarflexion strength are 5/5   Neurological:     Mental Status: She is alert.  Psychiatric:        Behavior: Behavior is cooperative.      UC Treatments / Results  Labs (all labs ordered are listed, but only abnormal results are displayed) Labs Reviewed  POCT INFLUENZA A/B    EKG   Radiology No results found.  Procedures Procedures (including critical care time)  Medications Ordered in UC Medications  ketorolac (TORADOL) 30 MG/ML injection 30 mg (30 mg Intramuscular Given 05/02/23 0925)    Initial Impression / Assessment and Plan / UC Course  I have reviewed the triage vital signs and the nursing notes.  Pertinent labs & imaging results that were available during my care of the patient were reviewed by me and considered in my medical decision making (see chart for details).      Final Clinical Impressions(s) / UC Diagnoses   Final  diagnoses:  Sciatica of right side  Acute non intractable tension-type headache  Headache Patient presents today with concerns for headache that starts at the back of her head and wraps around to the forehead.  She reports pain is off and on and denies current headache pain right now.  She reports increased fatigue and some nausea and states that when she does have headache pain she has photophobia, phonophobia.  She reports headache pain has not been relieved despite using Excedrin Migraine and Motrin.  Given symptoms and HPI I am most suspicious for likely tension headache.  Suspect that Toradol injection will likely benefit this.  Recommend warm compresses to the area as well as Tylenol.  Will provide meloxicam 7.5 mg p.o. daily as well.  ED return precautions reviewed and provided in after visit summary.  Follow-up as needed  Low back pain with sciatica Patient presents today with concerns for low back pain as well as right sided sciatica.  She reports that her pain is 10/10 and she is having difficulty with managing this.  She denies improvement with over-the-counter Excedrin and Motrin.  Physical exam is notable for pain over the SI joint as well as decreased range of motion on the right side with regard to hip and lumbar spine range of motion.  Strength is overall preserved.  Reviewed that sciatica can take prolonged recovery time and she will need to discuss this with her PCP for ongoing management and potential referral to physical therapy. Will provide Toradol 30 mg injection in clinic today for acute relief and will send in Meloxicam 7.5 mg PO every day for ongoing pain relief. Recommend refraining from other NSAID use while taking Meloxicam and reviewed that she can use Tylenol as needed for breakthrough pain. ED and return precautions were reviewed and provided in AVS. Follow up as needed.      Discharge Instructions      Your flu testing was negative.  At this time I suspect that your  right leg pain and back pain is likely due to something called sciatica.  This is typically caused when the sciatic nerve is pinched by the surrounding structures in the lower back or glutes which causes pain to radiate down the leg. We have provided you with a Toradol 30 mg injection to assist with the pain.  I have also sent in a medication called meloxicam.  This is an NSAID which typically helps with inflammation.  Please do not take other NSAID medications while you are taking the meloxicam as this could be rough on your kidneys.  You can use Tylenol as needed for further pain management while taking the meloxicam.  I recommend close follow-up with your primary care provider as sciatica typically requires ongoing management as well as physical therapy for adequate recovery. If at any point you are not able to feel your leg, you have weakness on one side of your body, more intense pain, difficulty controlling your bladder or bowel habits please go to the emergency room as this could be signs of a medical emergency. Your headache appears most consistent with likely tension type headache.  The ketorolac injection that we have given you should help with relieving this, as should the meloxicam and Tylenol.  Please make sure that you are staying well-hydrated while you are taking these medications.  You can also apply warm compresses around the neck and do light stretches to help relieve some of the pain in your muscles which is likely contributing to your headache.     ED Prescriptions     Medication  Sig Dispense Auth. Provider   meloxicam (MOBIC) 7.5 MG tablet Take 1 tablet (7.5 mg total) by mouth daily. 30 tablet Loni Delbridge E, PA-C      PDMP not reviewed this encounter.   Providence Crosby, PA-C 05/02/23 1103

## 2023-05-02 NOTE — ED Triage Notes (Signed)
 Pt c/o migraine headache and fatigue since yesterday. Took Excedrin and ibuprofen with no relief. C/o bilateral leg pain with chills x2 days.

## 2023-05-02 NOTE — Discharge Instructions (Signed)
 Your flu testing was negative.  At this time I suspect that your right leg pain and back pain is likely due to something called sciatica.  This is typically caused when the sciatic nerve is pinched by the surrounding structures in the lower back or glutes which causes pain to radiate down the leg. We have provided you with a Toradol 30 mg injection to assist with the pain.  I have also sent in a medication called meloxicam.  This is an NSAID which typically helps with inflammation.  Please do not take other NSAID medications while you are taking the meloxicam as this could be rough on your kidneys.  You can use Tylenol as needed for further pain management while taking the meloxicam.  I recommend close follow-up with your primary care provider as sciatica typically requires ongoing management as well as physical therapy for adequate recovery. If at any point you are not able to feel your leg, you have weakness on one side of your body, more intense pain, difficulty controlling your bladder or bowel habits please go to the emergency room as this could be signs of a medical emergency. Your headache appears most consistent with likely tension type headache.  The ketorolac injection that we have given you should help with relieving this, as should the meloxicam and Tylenol.  Please make sure that you are staying well-hydrated while you are taking these medications.  You can also apply warm compresses around the neck and do light stretches to help relieve some of the pain in your muscles which is likely contributing to your headache.

## 2023-10-18 ENCOUNTER — Other Ambulatory Visit: Payer: Self-pay | Admitting: Internal Medicine

## 2023-10-18 DIAGNOSIS — Z1231 Encounter for screening mammogram for malignant neoplasm of breast: Secondary | ICD-10-CM

## 2023-10-20 ENCOUNTER — Ambulatory Visit
Admission: RE | Admit: 2023-10-20 | Discharge: 2023-10-20 | Disposition: A | Discharge status: Home / Self Care | Source: Ambulatory Visit | Attending: Internal Medicine | Admitting: Internal Medicine

## 2023-10-20 DIAGNOSIS — Z1231 Encounter for screening mammogram for malignant neoplasm of breast: Secondary | ICD-10-CM

## 2024-02-17 ENCOUNTER — Encounter (HOSPITAL_COMMUNITY): Payer: Self-pay | Admitting: *Deleted

## 2024-02-17 ENCOUNTER — Ambulatory Visit (HOSPITAL_COMMUNITY)
Admission: EM | Admit: 2024-02-17 | Discharge: 2024-02-17 | Disposition: A | Discharge status: Home / Self Care | Attending: Emergency Medicine | Admitting: Emergency Medicine

## 2024-02-17 DIAGNOSIS — R200 Anesthesia of skin: Secondary | ICD-10-CM

## 2024-02-17 DIAGNOSIS — M25552 Pain in left hip: Secondary | ICD-10-CM | POA: Diagnosis not present

## 2024-02-17 MED ORDER — KETOROLAC TROMETHAMINE 30 MG/ML IJ SOLN
INTRAMUSCULAR | Status: AC
  Filled 2024-02-17: qty 1

## 2024-02-17 MED ORDER — KETOROLAC TROMETHAMINE 30 MG/ML IJ SOLN
30.0000 mg | Freq: Once | INTRAMUSCULAR | Status: AC
  Administered 2024-02-17: 30 mg via INTRAMUSCULAR

## 2024-02-17 MED ORDER — PREDNISONE 20 MG PO TABS: 40.0000 mg | ORAL_TABLET | Freq: Every day | ORAL | 0 refills | Status: AC

## 2024-02-17 NOTE — Discharge Instructions (Addendum)
 We have continue an injection of Toradol  to help with pain.  Take the steroids daily with breakfast for the next 5 days to help with inflammation.  Do not take NSAIDs with the steroids such as ibuprofen  or naproxen, as this can increase your risk of GI bleeding.  For any breakthrough pain you can take Tylenol .  Heat and gently stretch the hip area.  If pain persist please follow-up with orthopedic.  Follow-up with your primary care provider if numbness in fingers does not improve despite interventions.  Return to clinic for any new or urgent symptoms.

## 2024-02-17 NOTE — ED Triage Notes (Signed)
 Pt states she has left leg pain that is sharp coming from her hip down her leg X 4 days. She is not taking any meds.   She has bilateral hand numbness when she is laying on the side.

## 2024-02-17 NOTE — ED Provider Notes (Signed)
 " [facility]    CSN: [id number] Arrival date & time: 02/17/24  0840      History   Chief Complaint Chief Complaint  Patient presents with   Leg Pain   Numbness    HPI Rachel Wu is a 49 y.o. female.   Patient presents to clinic over concern of left hip pain with numbness along the lateral left leg and numbness in all fingertips for the past 4 days.  Having left sided hip pain that runs down the side of the left leg x4 days  Cannot think of anything that triggered her pain, she does work in a nursing home  Pain in the hip intensifies when she goes to move the hip or roll over in bed Took tylenol  but this did not help, she is not sleeping at night due to the pain Has not had inner leg numbness or incontinence, denies low back pain or urinary symptoms Has not had any fever, rashes or injuries Is also had numbness in the fingers, does not radiate up the arm    The history is provided by the patient.  Leg Pain   Past Medical History:  Diagnosis Date   Anemia    GERD (gastroesophageal reflux disease)    Seasonal allergies     There are no active problems to display for this patient.   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)   CESAREAN SECTION  2000,2007   CESAREAN SECTION  04/07/2011   Procedure: CESAREAN SECTION;  Surgeon: Donna Just, DO;  Location: [facility];  Service: Gynecology;  Laterality: N/A;  Repeat 04/14/11   CESAREAN SECTION N/A 10/29/2014   Procedure: REPEAT CESAREAN SECTION;  Surgeon: Aida DELENA Na, MD;  Location: [facility];  Service: Obstetrics;  Laterality: N/A;   DILATION AND CURETTAGE OF UTERUS  04/2010   missed ab    OB History     Gravida  8   Para  5   Term  5   Preterm  0   AB  3   Living  4      SAB  2   IAB  1   Ectopic  0   Multiple  0   Live Births  5            Home Medications    Prior to  Admission medications  Medication Sig Start Date End Date Taking? Authorizing Provider  predniSONE  (DELTASONE ) 20 MG tablet Take 2 tablets (40 mg total) by mouth daily for 5 days. 02/17/24 02/22/24 Yes Ball, Andric Kerce  G, FNP  meloxicam  (MOBIC ) 7.5 MG tablet Take 1 tablet (7.5 mg total) by mouth daily. 05/02/23   Mecum, Rocky BRAVO, PA-C    Family History Family History  Problem Relation Age of Onset   Hypertension Mother    Diabetes Father    Hypertension Father     Social History Social History[1]   Allergies   Patient has no known allergies.   Review of Systems Review of Systems  Per HPI  Physical Exam Triage Vital Signs ED Triage Vitals  Encounter Vitals Group     BP 02/17/24 0926 123/82     Girls Systolic BP Percentile --      Girls Diastolic BP Percentile --      Boys Systolic BP Percentile --      Boys Diastolic BP Percentile --  Pulse Rate 02/17/24 0926 73     Resp 02/17/24 0926 18     Temp 02/17/24 0926 97.8 F (36.6 C)     Temp Source 02/17/24 0926 Oral     SpO2 02/17/24 0926 98 %     Weight --      Height --      Head Circumference --      Peak Flow --      Pain Score 02/17/24 0925 7     Pain Loc --      Pain Education --      Exclude from Growth Chart --    No data found.  Updated Vital Signs BP 123/82 (BP Location: Left Arm)   Pulse 73   Temp 97.8 F (36.6 C) (Oral)   Resp 18   LMP 01/29/2015 Comment: had baby in Sept, had period in Nov and Dec, none since  SpO2 98%   Visual Acuity Right Eye Distance:   Left Eye Distance:   Bilateral Distance:    Right Eye Near:   Left Eye Near:    Bilateral Near:     Physical Exam Vitals and nursing note reviewed.  Constitutional:      Appearance: Normal appearance.  HENT:     Head: Normocephalic and atraumatic.     Right Ear: External ear normal.     Left Ear: External ear normal.     Nose: Nose normal.     Mouth/Throat:     Mouth: Mucous membranes are moist.  Eyes:     Conjunctiva/sclera:  Conjunctivae normal.  Cardiovascular:     Rate and Rhythm: Normal rate.  Pulmonary:     Effort: Pulmonary effort is normal. No respiratory distress.  Musculoskeletal:        General: Tenderness present. No swelling, deformity or signs of injury.     Left hip: Tenderness present.       Legs:  Skin:    General: Skin is warm and dry.     Findings: No rash.  Neurological:     General: No focal deficit present.     Mental Status: She is alert.  Psychiatric:        Mood and Affect: Mood normal.      UC Treatments / Results  Labs (all labs ordered are listed, but only abnormal results are displayed) Labs Reviewed - No data to display  EKG   Radiology No results found.  Procedures Procedures (including critical care time)  Medications Ordered in UC Medications  ketorolac  (TORADOL ) 30 MG/ML injection 30 mg (has no administration in time range)    Initial Impression / Assessment and Plan / UC Course  I have reviewed the triage vital signs and the nursing notes.  Pertinent labs & imaging results that were available during my care of the patient were reviewed by me and considered in my medical decision making (see chart for details).  Vitals and triage reviewed, patient is hemodynamically stable.  Breast capillary refill in all fingers, grip strength 5 out of 5.  Sensation intact.  Without evidence of cervical radiculopathy.  Encourage PCP follow-up if fingertip numbness persist.  Left lateral hip is tender to palpation.  Spine without tenderness, atraumatic.  Without inner leg numbness or incontinence, without evidence of cauda equina.  Suspect inflammation of the hip, discussed possibility of arthritis.  Will trial IM ketorolac , patient has had a hysterectomy.  Steroid burst for inflammation.  Orthopedic follow-up if hip pain persists.  Plan of care, follow-up  care, and return precautions given, no questions at this time.     Final Clinical Impressions(s) / UC Diagnoses    Final diagnoses:  Left hip pain  Numbness in both hands     Discharge Instructions      We have continue an injection of Toradol  to help with pain.  Take the steroids daily with breakfast for the next 5 days to help with inflammation.  Do not take NSAIDs with the steroids such as ibuprofen  or naproxen, as this can increase your risk of GI bleeding.  For any breakthrough pain you can take Tylenol .  Heat and gently stretch the hip area.  If pain persist please follow-up with orthopedic.  Follow-up with your primary care provider if numbness in fingers does not improve despite interventions.  Return to clinic for any new or urgent symptoms.      ED Prescriptions     Medication Sig Dispense Auth. Provider   predniSONE  (DELTASONE ) 20 MG tablet Take 2 tablets (40 mg total) by mouth daily for 5 days. 10 tablet Ball, Javis Abboud  G, FNP      PDMP not reviewed this encounter.     [1]  Social History Tobacco Use   Smoking status: Never   Smokeless tobacco: Never  Vaping Use   Vaping status: Never Used  Substance Use Topics   Alcohol use: No   Drug use: No     Ball, Clinton Wahlberg  G, FNP 02/17/24 1016  "
